# Patient Record
Sex: Male | Born: 1985 | Race: White | Hispanic: No | Marital: Married | Smoking: Never smoker
Health system: Southern US, Community
[De-identification: ages and names within clinical notes are randomized; demographics above are authoritative.]

## PROBLEM LIST (undated history)

## (undated) DIAGNOSIS — I10 Essential (primary) hypertension: Secondary | ICD-10-CM

## (undated) DIAGNOSIS — E039 Hypothyroidism, unspecified: Secondary | ICD-10-CM

## (undated) HISTORY — DX: Hypothyroidism, unspecified: E03.9

## (undated) HISTORY — DX: Essential (primary) hypertension: I10

---

## 2010-04-28 HISTORY — PX: THYROIDECTOMY: SHX17

## 2017-02-06 ENCOUNTER — Encounter: Payer: Self-pay | Admitting: Allergy & Immunology

## 2017-02-06 ENCOUNTER — Ambulatory Visit (INDEPENDENT_AMBULATORY_CARE_PROVIDER_SITE_OTHER): Payer: BLUE CROSS/BLUE SHIELD | Admitting: Allergy & Immunology

## 2017-02-06 VITALS — BP 142/80 | HR 84 | Temp 98.5°F | Resp 18 | Ht 72.5 in | Wt >= 6400 oz

## 2017-02-06 DIAGNOSIS — J302 Other seasonal allergic rhinitis: Secondary | ICD-10-CM

## 2017-02-06 DIAGNOSIS — J3089 Other allergic rhinitis: Secondary | ICD-10-CM

## 2017-02-06 MED ORDER — MONTELUKAST SODIUM 10 MG PO TABS: 10.0000 mg | ORAL_TABLET | Freq: Every day | ORAL | 5 refills | Status: DC

## 2017-02-06 NOTE — Progress Notes (Signed)
NEW PATIENT  Date of Service/Encounter:  02/06/17  Referring provider: Sharmon Leyden, MD   Assessment:   Seasonal and perennial allergic rhinitis (grasses, weeds, trees, molds, dust mites, cockroach)   Plan/Recommendations:   1. Seasonal and perennial allergic rhinitis - Testing today showed: trees, weeds, grasses, indoor molds, outdoor molds, dust mites and cockroach - Avoidance measures provided. - We will try to manage his symptoms with medications alone, and then consider allergy shots in the future.  - Continue with: Xyzal (levocetirizine)  tablet once daily - Start taking: Singulair (montelukast)  daily and Dymista (fluticasone/azelastine) two sprays per nostril 1-2 times daily as needed - You can use an extra dose of the antihistamine, if needed, for breakthrough symptoms.  - Consider nasal saline rinses 1-2 times daily to remove allergens from the nasal cavities as well as help with mucous clearance (this is especially helpful to do before the nasal sprays are given) - Consider allergy shots as a means of long-term control. - Allergy shots "re-train" and "reset" the immune system to ignore environmental allergens and decrease the resulting immune response to those allergens (sneezing, itchy watery eyes, runny nose, nasal congestion, etc).    - Allergy shots improve symptoms in 75-85% of patients.  - We can discuss more at the next appointment if the medications are not working for you.   2. Return in about 6 months (around 08/07/2017).  Subjective:   Timothy Jenkins is a 31 y.o. male presenting today for evaluation of  Chief Complaint  Patient presents with  . Allergies    Timothy Jenkins has a history of the following: There are no active problems to display for this patient.   History obtained from: chart review and patient and his wife.  Timothy Jenkins was referred by Sharmon Leyden, MD.     Timothy Jenkins is a 31 y.o. male presenting  for an allergy evaluation. He has had these symptoms for years. He thinks that he has had these since he was a child. He never got allergy tested. Symptoms are mainly nasal congestion and sneezing. He denies itchy watery eyes. He does have intermittent throat clearing. Symptoms are worse in the fall and the changes in the seasons. Spring is also a bad time of the year for him. There are no pets at home aside from an adorable 30 month old named Matthewland "[address] He has had dogs and cats in the past and they have not bothered him at all.   Currently he is using Xyzal which does provide some relief as needed. He does have breakthrough sneezing. He was prescribed a nose spray a while away, but it never worked for him. He is unsure which one he was taking.    Timothy Jenkins does not have asthma. He is not concerned with foods allergies at all. He says today that he can stand to be more picky about his foods. He is currently trying to adhere to a ketogenic diet. He has no history of urticaria. Otherwise, there is no history of other atopic diseases, including drug allergies, stinging insect allergies, or urticaria. There is no significant infectious history. Vaccinations are up to date.   He broke both legs, hip, and femur at age 33 years. He had to lear to re-walk at home.    Past Medical History: There are no active problems to display for this patient.   Medication List:  Allergies as of 02/06/2017      Reactions   Hydrocodone Nausea Only  Medication List       Accurate as of 02/06/17 10:13 AM. Always use your most recent med list.          levocetirizine 5 MG tablet Commonly known as:  XYZAL Take by mouth.   levothyroxine 175 MCG tablet Commonly known as:  SYNTHROID, LEVOTHROID 300 mcg.   lisinopril-hydrochlorothiazide 20-25 MG tablet Commonly known as:  PRINZIDE,ZESTORETIC   ranitidine 150 MG tablet Commonly known as:  ZANTAC Take 150 mg by mouth.       Birth History:  non-contributory.  Developmental History: non-contributory.   Past Surgical History: Past Surgical History:  Procedure Laterality Date  . THYROIDECTOMY  2012     Family History: Family History  Problem Relation Age of Onset  . Allergic rhinitis Mother   . Asthma Mother   . Urticaria Mother   . Emphysema Mother   . Sinusitis Mother      Social History: Timothy Jenkins lives at home with his wife and almost one year old son.     Review of Systems: a 14-point review of systems is pertinent for what is mentioned in HPI.  Otherwise, all other systems were negative. Constitutional: negative other than that listed in the HPI Eyes: negative other than that listed in the HPI Ears, nose, mouth, throat, and face: negative other than that listed in the HPI Respiratory: negative other than that listed in the HPI Cardiovascular: negative other than that listed in the HPI Gastrointestinal: negative other than that listed in the HPI Genitourinary: negative other than that listed in the HPI Integument: negative other than that listed in the HPI Hematologic: negative other than that listed in the HPI Musculoskeletal: negative other than that listed in the HPI Neurological: negative other than that listed in the HPI Allergy/Immunologic: negative other than that listed in the HPI    Objective:   Blood pressure (!) 142/80, pulse 84, temperature 98.5 F (36.9 C), temperature source Oral, resp. rate 18, height 6' 0.5" (1.842 m), weight (!) 445 lb 9.6 oz (202.1 kg). Body mass index is 59.6 kg/m.   Physical Exam:  General: Alert, interactive, in no acute distress. Pleasant obese male. Cooperative with the exam.  Eyes: No conjunctival injection bilaterally, no discharge on the right, no discharge on the left, no Horner-Trantas dots present and allergic shiners present bilaterally. PERRL bilaterally. EOMI without pain. No photophobia.  Ears: Left TM pearly gray with normal light reflex, Right  OME, Right TM intact without perforation and Left TM intact without perforation.  Nose/Throat: External nose within normal limits, nasal crease present and septum midline. Turbinates edematous and pale with clear discharge. Posterior oropharynx erythematous with cobblestoning in the posterior oropharynx. Tonsils 2+ without exudates.  Tongue without thrush. Neck: Supple without thyromegaly. Trachea midline. Adenopathy: shoddy bilateral anterior cervical lymphadenopathy Lungs: Clear to auscultation without wheezing, rhonchi or rales. No increased work of breathing. CV: Normal S1/S2. No murmurs. Capillary refill <2 seconds.  Abdomen: Nondistended, nontender. No guarding or rebound tenderness. Bowel sounds present in all fields and hypoactive  Skin: Warm and dry, without lesions or rashes. Extremities:  No clubbing, cyanosis or edema. Neuro:   Grossly intact. No focal deficits appreciated. Responsive to questions.  Diagnostic studies:    Allergy Studies:   Indoor/Outdoor Percutaneous Adult Environmental Panel: positive to cocklebur, burweed marsh elder, short ragweed, giant ragweed, rough marsh elder, red cedar, epicoccum, Df mite, Dp mites and cockroach. Otherwise negative with adequate controls.  Indoor/Outdoor Selected Intradermal Environmental Panel: positive to [place] grass, [employer]  grass, Grass mix, mold mix #1, mold mix #2, mold mix #3 and mold mix #4. Otherwise negative with adequate controls.      Malachi Bonds, MD [facility]

## 2017-02-06 NOTE — Patient Instructions (Addendum)
1. Seasonal and perennial allergic rhinitis - Testing today showed: trees, weeds, grasses, indoor molds, outdoor molds, dust mites and cockroach - Avoidance measures provided. - Continue with: Xyzal (levocetirizine)  tablet once daily - Start taking: Singulair (montelukast)  daily and Dymista (fluticasone/azelastine) two sprays per nostril 1-2 times daily as needed - You can use an extra dose of the antihistamine, if needed, for breakthrough symptoms.  - Consider nasal saline rinses 1-2 times daily to remove allergens from the nasal cavities as well as help with mucous clearance (this is especially helpful to do before the nasal sprays are given) - Consider allergy shots as a means of long-term control. - Allergy shots "re-train" and "reset" the immune system to ignore environmental allergens and decrease the resulting immune response to those allergens (sneezing, itchy watery eyes, runny nose, nasal congestion, etc).    - Allergy shots improve symptoms in 75-85% of patients.  - We can discuss more at the next appointment if the medications are not working for you.   2. Return in about 6 months (around 08/07/2017).   Please inform us of any Emergency Department visits, hospitalizations, or changes in symptoms. Call us before going to the ED for breathing or allergy symptoms since we might be able to fit you in for a sick visit. Feel free to contact us anytime with any questions, problems, or concerns.  It was a pleasure to see you and your family again today! Enjoy the upcoming fall season!  Websites that have reliable patient information: 1. American Academy of Asthma, Allergy, and Immunology: www.aaaai.org 2. Food Allergy Research and Education (FARE): foodallergy.org 3. Mothers of Asthmatics: http://www.asthmacommunitynetwork.org 4. American College of Allergy, Asthma, and Immunology: www.acaai.org   Election Day is coming up on Tuesday, November 6th! Make your voice heard!  Register to vote at [email or web address]!     The last day to register is October 12th!   You can check the status of your registration by looking it up at [email or web address]  [facility] of Elections: [email or web address]  [facility] of Elections:  [email or web address]  Reducing Pollen Exposure  The American Academy of Allergy, Asthma and Immunology suggests the following steps to reduce your exposure to pollen during allergy seasons.    1. Do not hang sheets or clothing out to dry; pollen may collect on these items. 2. Do not mow lawns or spend time around freshly cut grass; mowing stirs up pollen. 3. Keep windows closed at night.  Keep car windows closed while driving. 4. Minimize morning activities outdoors, a time when pollen counts are usually at their highest. 5. Stay indoors as much as possible when pollen counts or humidity is high and on windy days when pollen tends to remain in the air longer. 6. Use air conditioning when possible.  Many air conditioners have filters that trap the pollen spores. 7. Use a HEPA room air filter to remove pollen form the indoor air you breathe.  Control of Mold Allergen   Mold and fungi can grow on a variety of surfaces provided certain temperature and moisture conditions exist.  Outdoor molds grow on plants, decaying vegetation and soil.  The major outdoor mold, Alternaria and Cladosporium, are found in very high numbers during hot and dry conditions.  Generally, a late Summer - Fall peak is seen for common outdoor fungal spores.  Rain will temporarily lower outdoor mold spore count, but counts rise rapidly when the rainy period ends.  The most important indoor molds are Aspergillus and  Penicillium.  Dark, humid and poorly ventilated basements are ideal sites for mold growth.  The next most common sites of mold growth are the bathroom and the  kitchen.  Outdoor (Seasonal) Mold Control  Positive outdoor molds via skin testing: Alternaria, Cladosporium, Bipolaris (Helminthsporium), Drechslera (Curvalaria), Mucor and Epicoccum  1. Use air conditioning and keep windows closed 2. Avoid exposure to decaying vegetation. 3. Avoid leaf raking. 4. Avoid grain handling. 5. Consider wearing a face mask if working in moldy areas.  6.   Indoor (Perennial) Mold Control   Positive indoor molds via skin testing: Aspergillus, Penicillium, Fusarium, Aureobasidium (Pullulara) and Rhizopus  1. Maintain humidity below 50%. 2. Clean washable surfaces with 5% bleach solution. 3. Remove sources e.g. contaminated carpets.     Control of House Dust Mite Allergen    House dust mites play a major role in allergic asthma and rhinitis.  They occur in environments with high humidity wherever human skin, the food for dust mites is found. High levels have been detected in dust obtained from mattresses, pillows, carpets, upholstered furniture, bed covers, clothes and soft toys.  The principal allergen of the house dust mite is found in its feces.  A gram of dust may contain 1,000 mites and 250,000 fecal particles.  Mite antigen is easily measured in the air during house cleaning activities.    1. Encase mattresses, including the box spring, and pillow, in an air tight cover.  Seal the zipper end of the encased mattresses with wide adhesive tape. 2. Wash the bedding in water of 130 degrees Farenheit weekly.  Avoid cotton comforters/quilts and flannel bedding: the most ideal bed covering is the dacron comforter. 3. Remove all upholstered furniture from the bedroom. 4. Remove carpets, carpet padding, rugs, and non-washable window drapes from the bedroom.  Wash drapes weekly or use plastic window coverings. 5. Remove all non-washable stuffed toys from the bedroom.  Wash stuffed toys weekly. 6. Have the room cleaned frequently with a vacuum cleaner and a damp  dust-mop.  The patient should not be in a room which is being cleaned and should wait 1 hour after cleaning before going into the room. 7. Close and seal all heating outlets in the bedroom.  Otherwise, the room will become filled with dust-laden air.  An electric heater can be used to heat the room. 8. Reduce indoor humidity to less than 50%.  Do not use a humidifier.  Control of Cockroach Allergen  Cockroach allergen has been identified as an important cause of acute attacks of asthma, especially in urban settings.  There are fifty-five species of cockroach that exist in the [place], however only three, the [place], [place] species produce allergen that can affect patients with Asthma.  Allergens can be obtained from fecal particles, egg casings and secretions from cockroaches.    1. Remove food sources. 2. Reduce access to water. 3. Seal access and entry points. 4. Spray runways with 0.5-1% Diazinon or Chlorpyrifos 5. Blow boric acid power under stoves and refrigerator. 6. Place bait stations (hydramethylnon) at feeding sites.

## 2017-08-07 ENCOUNTER — Ambulatory Visit: Payer: BLUE CROSS/BLUE SHIELD | Admitting: Allergy & Immunology

## 2017-08-07 ENCOUNTER — Ambulatory Visit (INDEPENDENT_AMBULATORY_CARE_PROVIDER_SITE_OTHER): Payer: BLUE CROSS/BLUE SHIELD | Admitting: Allergy & Immunology

## 2017-08-07 ENCOUNTER — Encounter: Payer: Self-pay | Admitting: Allergy & Immunology

## 2017-08-07 VITALS — BP 170/106 | HR 80 | Temp 97.9°F | Resp 16

## 2017-08-07 DIAGNOSIS — J3089 Other allergic rhinitis: Secondary | ICD-10-CM

## 2017-08-07 DIAGNOSIS — J302 Other seasonal allergic rhinitis: Secondary | ICD-10-CM

## 2017-08-07 MED ORDER — MONTELUKAST SODIUM 10 MG PO TABS: ORAL_TABLET | ORAL | 5 refills | Status: DC

## 2017-08-07 MED ORDER — LEVOCETIRIZINE DIHYDROCHLORIDE 5 MG PO TABS: ORAL_TABLET | ORAL | 5 refills | Status: AC

## 2017-08-07 NOTE — Patient Instructions (Addendum)
1. Seasonal and perennial allergic rhinitis (trees, weeds, grasses, indoor molds, outdoor molds, dust mites and cockroach) - Continue with: Xyzal (levocetirizine) 5mg  tablet once daily, Singulair (montelukast) 10mg  daily and Dymista (fluticasone/azelastine) two sprays per nostril 1-2 times daily as needed - You can use an extra dose of the antihistamine, if needed, for breakthrough symptoms.  - Consider nasal saline rinses 1-2 times daily to remove allergens from the nasal cavities as well as help with mucous clearance (this is especially helpful to do before the nasal sprays are given) - Consider allergy shots as a means of long-term control. - Allergy shots "re-train" and "reset" the immune system to ignore environmental allergens and decrease the resulting immune response to those allergens (sneezing, itchy watery eyes, runny nose, nasal congestion, etc).    - Allergy shots improve symptoms in 75-85% of patients.   2. Return in about 1 year (around 08/08/2018). PICK UP YOUR BP MEDICATION!    Please inform us of any Emergency Department visits, hospitalizations, or changes in symptoms. Call us before going to the ED for breathing or allergy symptoms since we might be able to fit you in for a sick visit. Feel free to contact us anytime with any questions, problems, or concerns.  It was a pleasure to see you and your family again today!  Websites that have reliable patient information: 1. American Academy of Asthma, Allergy, and Immunology: www.aaaai.org 2. Food Allergy Research and Education (FARE): foodallergy.org 3. Mothers of Asthmatics: http://www.asthmacommunitynetwork.org 4. American College of Allergy, Asthma, and Immunology: www.acaai.org   Allergy Shots   Allergies are the result of a chain reaction that starts in the immune system. Your immune system controls how your body defends itself. For instance, if you have an allergy to pollen, your immune system identifies pollen as an  invader or allergen. Your immune system overreacts by producing antibodies called Immunoglobulin E (IgE). These antibodies travel to cells that release chemicals, causing an allergic reaction.  The concept behind allergy immunotherapy, whether it is received in the form of shots or tablets, is that the immune system can be desensitized to specific allergens that trigger allergy symptoms. Although it requires time and patience, the payback can be long-term relief.  How Do Allergy Shots Work?  Allergy shots work much like a vaccine. Your body responds to injected amounts of a particular allergen given in increasing doses, eventually developing a resistance and tolerance to it. Allergy shots can lead to decreased, minimal or no allergy symptoms.  There generally are two phases: build-up and maintenance. Build-up often ranges from three to six months and involves receiving injections with increasing amounts of the allergens. The shots are typically given once or twice a week, though more rapid build-up schedules are sometimes used.  The maintenance phase begins when the most effective dose is reached. This dose is different for each person, depending on how allergic you are and your response to the build-up injections. Once the maintenance dose is reached, there are longer periods between injections, typically two to four weeks.  Occasionally doctors give cortisone-type shots that can temporarily reduce allergy symptoms. These types of shots are different and should not be confused with allergy immunotherapy shots.  Who Can Be Treated with Allergy Shots?  Allergy shots may be a good treatment approach for people with allergic rhinitis (hay fever), allergic asthma, conjunctivitis (eye allergy) or stinging insect allergy.   Before deciding to begin allergy shots, you should consider:  . The length of allergy season and  the severity of your symptoms . Whether medications and/or changes to your  environment can control your symptoms . Your desire to avoid long-term medication use . Time: allergy immunotherapy requires a major time commitment . Cost: may vary depending on your insurance coverage  Allergy shots for children age 11five and older are effective and often well tolerated. They might prevent the onset of new allergen sensitivities or the progression to asthma.  Allergy shots are not started on patients who are pregnant but can be continued on patients who become pregnant while receiving them. In some patients with other medical conditions or who take certain common medications, allergy shots may be of risk. It is important to mention other medications you talk to your allergist.   When Will I Feel Better?  Some may experience decreased allergy symptoms during the build-up phase. For others, it may take as long as 12 months on the maintenance dose. If there is no improvement after a year of maintenance, your allergist will discuss other treatment options with you.  If you aren't responding to allergy shots, it may be because there is not enough dose of the allergen in your vaccine or there are missing allergens that were not identified during your allergy testing. Other reasons could be that there are high levels of the allergen in your environment or major exposure to non-allergic triggers like tobacco smoke.  What Is the Length of Treatment?  Once the maintenance dose is reached, allergy shots are generally continued for three to five years. The decision to stop should be discussed with your allergist at that time. Some people may experience a permanent reduction of allergy symptoms. Others may relapse and a longer course of allergy shots can be considered.  What Are the Possible Reactions?  The two types of adverse reactions that can occur with allergy shots are local and systemic. Common local reactions include very mild redness and swelling at the injection site, which can  happen immediately or several hours after. A systemic reaction, which is less common, affects the entire body or a particular body system. They are usually mild and typically respond quickly to medications. Signs include increased allergy symptoms such as sneezing, a stuffy nose or hives.  Rarely, a serious systemic reaction called anaphylaxis can develop. Symptoms include swelling in the throat, wheezing, a feeling of tightness in the chest, nausea or dizziness. Most serious systemic reactions develop within 30 minutes of allergy shots. This is why it is strongly recommended you wait in your doctor's office for 30 minutes after your injections. Your allergist is trained to watch for reactions, and his or her staff is trained and equipped with the proper medications to identify and treat them.  Who Should Administer Allergy Shots?  The preferred location for receiving shots is your prescribing allergist's office. Injections can sometimes be given at another facility where the physician and staff are trained to recognize and treat reactions, and have received instructions by your prescribing allergist.

## 2017-08-07 NOTE — Progress Notes (Signed)
FOLLOW UP  Date of Service/Encounter:  08/07/17   Assessment:   Seasonal and perennial allergic rhinitis (trees, weeds, grasses, indoor molds, outdoor molds, dust mites and cockroach)  Plan/Recommendations:    1. Seasonal and perennial allergic rhinitis (trees, weeds, grasses, indoor molds, outdoor molds, dust mites and cockroach) - Continue with: Xyzal (levocetirizine) 5mg  tablet once daily, Singulair (montelukast) 10mg  daily and Dymista (fluticasone/azelastine) two sprays per nostril 1-2 times daily as needed - You can use an extra dose of the antihistamine, if needed, for breakthrough symptoms.  - Consider nasal saline rinses 1-2 times daily to remove allergens from the nasal cavities as well as help with mucous clearance (this is especially helpful to do before the nasal sprays are given) - Consider allergy shots as a means of long-term control. - Allergy shots "re-train" and "reset" the immune system to ignore environmental allergens and decrease the resulting immune response to those allergens (sneezing, itchy watery eyes, runny nose, nasal congestion, etc).    - Allergy shots improve symptoms in 75-85% of patients.   2. Return in about 1 year (around 08/08/2018).   Subjective:   West BaliChristopher Fedorchak is a 32 y.o. male presenting today for follow up of  Chief Complaint  Patient presents with  . Allergic Rhinitis     West BaliChristopher Carras has a history of the following: There are no active problems to display for this patient.   History obtained from: chart review and patient and his wife.  Marga Hootshristopher Jager's Primary Care Provider is Delane GingerGill, Wendee BeaversPatrick M, MD.     Cristal DeerChristopher is a 32 y.o. male presenting for a follow up visit.  He was last seen as a new patient in October 2018.  At that time, he had testing that was positive to grasses, weeds, trees, molds, dust mite, and cockroach.  We started him on Xyzal 5 mg daily and also added on Singulair 10 mg daily and Dymista.  We  did discuss allergy shots as a means of long-term control.  Since the last visit, he has done well.  He remains on the Xyzal 5 mg daily and the Singulair 10 mg daily.  He does need refills on the Singulair and is wondering whether he can have a prescription for the Xyzal to see if his insurance will cover it.  He is still interested in allergy shots, but has not contacted his insurance company yet.  Overall he is happy with how well he is doing. He does not like the taste of the Dymista which he using only PRN. He has had no sinus infections in the interim. We are still thinking about shots. He recently changed hours at his job but they are not any better.   His blood pressure is elevated today, and he requested refills from his PCP a month ago but they are just now refilling it. He is going to pick up the medication today (lisinopril/HCTZ).   Otherwise, there have been no changes to his past medical history, surgical history, family history, or social history.  In the interim, he and his wife have found out that they will be having a baby girl whom they will name Zoe. Their little boy - Durene CalHunter - is not so sure about this. [employer] has paid leave people who have been there for a while.     Review of Systems: a 14-point review of systems is pertinent for what is mentioned in HPI.  Otherwise, all other systems were negative. Constitutional: negative other than that listed  in the HPI Eyes: negative other than that listed in the HPI Ears, nose, mouth, throat, and face: negative other than that listed in the HPI Respiratory: negative other than that listed in the HPI Cardiovascular: negative other than that listed in the HPI Gastrointestinal: negative other than that listed in the HPI Genitourinary: negative other than that listed in the HPI Integument: negative other than that listed in the HPI Hematologic: negative other than that listed in the HPI Musculoskeletal: negative other than that listed  in the HPI Neurological: negative other than that listed in the HPI Allergy/Immunologic: negative other than that listed in the HPI    Objective:   Blood pressure (!) 170/106, pulse 80, temperature 97.9 F (36.6 C), temperature source Oral, resp. rate 16, SpO2 97 %. There is no height or weight on file to calculate BMI.   Physical Exam:  General: Alert, interactive, in no acute distress. Pleasant.  Eyes: No conjunctival injection bilaterally, no discharge on the right, no discharge on the left, no Horner-Trantas dots present and allergic shiners present bilaterally. PERRL bilaterally. EOMI without pain. No photophobia.  Ears: Right TM pearly gray with normal light reflex, Left TM pearly gray with normal light reflex, Right TM intact without perforation and Left TM intact without perforation.  Nose/Throat: External nose within normal limits and septum midline. Turbinates edematous and pale with thick discharge. Posterior oropharynx mildly erythematous without cobblestoning in the posterior oropharynx. Tonsils 2+ without exudates.  Tongue without thrush. Lungs: Clear to auscultation without wheezing, rhonchi or rales. No increased work of breathing. CV: Normal S1/S2. No murmurs. Capillary refill <2 seconds.  Skin: Warm and dry, without lesions or rashes. Neuro:   Grossly intact. No focal deficits appreciated. Responsive to questions.  Diagnostic studies: none      Malachi Bonds, MD [facility]

## 2018-01-07 ENCOUNTER — Other Ambulatory Visit: Payer: Self-pay | Admitting: *Deleted

## 2018-01-07 MED ORDER — MONTELUKAST SODIUM 10 MG PO TABS: ORAL_TABLET | ORAL | 5 refills | Status: DC

## 2018-02-18 NOTE — Progress Notes (Signed)
We received notification from Timothy Jenkins that he is interested in pursuing allergen immunotherapy. Prescriptions written and routed to the Immunotherapy Team.   Malachi Bonds, MD  [facility]

## 2018-02-18 NOTE — Addendum Note (Signed)
Addended by: Alfonse Spruce on: 02/18/2018 09:10 AM   Modules accepted: Orders

## 2018-02-22 DIAGNOSIS — J301 Allergic rhinitis due to pollen: Secondary | ICD-10-CM

## 2018-02-22 NOTE — Progress Notes (Signed)
VIALS EXP 02-23-19 

## 2018-02-23 DIAGNOSIS — J3089 Other allergic rhinitis: Secondary | ICD-10-CM

## 2018-03-19 ENCOUNTER — Ambulatory Visit (INDEPENDENT_AMBULATORY_CARE_PROVIDER_SITE_OTHER): Payer: BLUE CROSS/BLUE SHIELD | Admitting: *Deleted

## 2018-03-19 DIAGNOSIS — J309 Allergic rhinitis, unspecified: Secondary | ICD-10-CM

## 2018-03-19 NOTE — Progress Notes (Signed)
Immunotherapy   Patient Details  Name: Timothy Jenkins MRN: [id number] Date of Birth: 06-09-1985  03/19/2018  Timothy Jenkins started injections for  mold-dustmite-cockroach and grass-weed-tree Following schedule: B  Frequency:2 times per week Epi-Pen:Epi-Pen Available  Consent signed and patient instructions given.   Timothy Jenkins 03/19/2018, 3:47 PM

## 2018-03-24 ENCOUNTER — Ambulatory Visit (INDEPENDENT_AMBULATORY_CARE_PROVIDER_SITE_OTHER): Payer: BLUE CROSS/BLUE SHIELD | Admitting: *Deleted

## 2018-03-24 DIAGNOSIS — J309 Allergic rhinitis, unspecified: Secondary | ICD-10-CM

## 2018-03-31 ENCOUNTER — Ambulatory Visit (INDEPENDENT_AMBULATORY_CARE_PROVIDER_SITE_OTHER): Payer: BLUE CROSS/BLUE SHIELD

## 2018-03-31 DIAGNOSIS — J309 Allergic rhinitis, unspecified: Secondary | ICD-10-CM | POA: Diagnosis not present

## 2018-04-06 ENCOUNTER — Ambulatory Visit (INDEPENDENT_AMBULATORY_CARE_PROVIDER_SITE_OTHER): Payer: BLUE CROSS/BLUE SHIELD | Admitting: *Deleted

## 2018-04-06 DIAGNOSIS — J309 Allergic rhinitis, unspecified: Secondary | ICD-10-CM | POA: Diagnosis not present

## 2018-04-09 ENCOUNTER — Ambulatory Visit (INDEPENDENT_AMBULATORY_CARE_PROVIDER_SITE_OTHER): Payer: BLUE CROSS/BLUE SHIELD

## 2018-04-09 DIAGNOSIS — J309 Allergic rhinitis, unspecified: Secondary | ICD-10-CM

## 2018-04-13 ENCOUNTER — Ambulatory Visit (INDEPENDENT_AMBULATORY_CARE_PROVIDER_SITE_OTHER): Payer: BLUE CROSS/BLUE SHIELD

## 2018-04-13 DIAGNOSIS — J309 Allergic rhinitis, unspecified: Secondary | ICD-10-CM

## 2018-04-20 ENCOUNTER — Ambulatory Visit (INDEPENDENT_AMBULATORY_CARE_PROVIDER_SITE_OTHER): Payer: BLUE CROSS/BLUE SHIELD

## 2018-04-20 DIAGNOSIS — J309 Allergic rhinitis, unspecified: Secondary | ICD-10-CM | POA: Diagnosis not present

## 2018-04-27 ENCOUNTER — Ambulatory Visit (INDEPENDENT_AMBULATORY_CARE_PROVIDER_SITE_OTHER): Payer: BLUE CROSS/BLUE SHIELD

## 2018-04-27 DIAGNOSIS — J309 Allergic rhinitis, unspecified: Secondary | ICD-10-CM | POA: Diagnosis not present

## 2018-05-04 ENCOUNTER — Ambulatory Visit (INDEPENDENT_AMBULATORY_CARE_PROVIDER_SITE_OTHER): Payer: BLUE CROSS/BLUE SHIELD

## 2018-05-04 DIAGNOSIS — J309 Allergic rhinitis, unspecified: Secondary | ICD-10-CM

## 2018-05-06 ENCOUNTER — Ambulatory Visit (INDEPENDENT_AMBULATORY_CARE_PROVIDER_SITE_OTHER): Payer: BLUE CROSS/BLUE SHIELD

## 2018-05-06 DIAGNOSIS — J309 Allergic rhinitis, unspecified: Secondary | ICD-10-CM | POA: Diagnosis not present

## 2018-05-11 ENCOUNTER — Ambulatory Visit (INDEPENDENT_AMBULATORY_CARE_PROVIDER_SITE_OTHER): Payer: BLUE CROSS/BLUE SHIELD | Admitting: *Deleted

## 2018-05-11 DIAGNOSIS — J309 Allergic rhinitis, unspecified: Secondary | ICD-10-CM | POA: Diagnosis not present

## 2018-05-18 ENCOUNTER — Ambulatory Visit (INDEPENDENT_AMBULATORY_CARE_PROVIDER_SITE_OTHER): Payer: BLUE CROSS/BLUE SHIELD

## 2018-05-18 DIAGNOSIS — J309 Allergic rhinitis, unspecified: Secondary | ICD-10-CM

## 2018-05-26 ENCOUNTER — Ambulatory Visit (INDEPENDENT_AMBULATORY_CARE_PROVIDER_SITE_OTHER): Payer: BLUE CROSS/BLUE SHIELD | Admitting: *Deleted

## 2018-05-26 DIAGNOSIS — J309 Allergic rhinitis, unspecified: Secondary | ICD-10-CM | POA: Diagnosis not present

## 2018-06-02 ENCOUNTER — Ambulatory Visit (INDEPENDENT_AMBULATORY_CARE_PROVIDER_SITE_OTHER): Payer: BLUE CROSS/BLUE SHIELD | Admitting: *Deleted

## 2018-06-02 DIAGNOSIS — J309 Allergic rhinitis, unspecified: Secondary | ICD-10-CM | POA: Diagnosis not present

## 2018-06-10 ENCOUNTER — Ambulatory Visit: Payer: Self-pay

## 2018-06-10 ENCOUNTER — Ambulatory Visit (INDEPENDENT_AMBULATORY_CARE_PROVIDER_SITE_OTHER): Payer: BLUE CROSS/BLUE SHIELD

## 2018-06-10 DIAGNOSIS — J309 Allergic rhinitis, unspecified: Secondary | ICD-10-CM

## 2018-06-16 ENCOUNTER — Ambulatory Visit (INDEPENDENT_AMBULATORY_CARE_PROVIDER_SITE_OTHER): Payer: BLUE CROSS/BLUE SHIELD | Admitting: *Deleted

## 2018-06-16 DIAGNOSIS — J309 Allergic rhinitis, unspecified: Secondary | ICD-10-CM | POA: Diagnosis not present

## 2018-06-21 ENCOUNTER — Ambulatory Visit: Payer: Self-pay

## 2018-06-24 ENCOUNTER — Ambulatory Visit (INDEPENDENT_AMBULATORY_CARE_PROVIDER_SITE_OTHER): Payer: BLUE CROSS/BLUE SHIELD

## 2018-06-24 DIAGNOSIS — J309 Allergic rhinitis, unspecified: Secondary | ICD-10-CM

## 2018-07-01 ENCOUNTER — Ambulatory Visit (INDEPENDENT_AMBULATORY_CARE_PROVIDER_SITE_OTHER): Payer: BLUE CROSS/BLUE SHIELD

## 2018-07-01 DIAGNOSIS — J309 Allergic rhinitis, unspecified: Secondary | ICD-10-CM

## 2018-07-07 ENCOUNTER — Ambulatory Visit (INDEPENDENT_AMBULATORY_CARE_PROVIDER_SITE_OTHER): Payer: BLUE CROSS/BLUE SHIELD

## 2018-07-07 DIAGNOSIS — J309 Allergic rhinitis, unspecified: Secondary | ICD-10-CM

## 2018-07-14 ENCOUNTER — Ambulatory Visit (INDEPENDENT_AMBULATORY_CARE_PROVIDER_SITE_OTHER): Payer: BLUE CROSS/BLUE SHIELD

## 2018-07-14 DIAGNOSIS — J309 Allergic rhinitis, unspecified: Secondary | ICD-10-CM

## 2018-07-21 ENCOUNTER — Ambulatory Visit (INDEPENDENT_AMBULATORY_CARE_PROVIDER_SITE_OTHER): Payer: BLUE CROSS/BLUE SHIELD

## 2018-07-21 DIAGNOSIS — J309 Allergic rhinitis, unspecified: Secondary | ICD-10-CM | POA: Diagnosis not present

## 2018-07-29 ENCOUNTER — Ambulatory Visit (INDEPENDENT_AMBULATORY_CARE_PROVIDER_SITE_OTHER): Payer: BLUE CROSS/BLUE SHIELD | Admitting: *Deleted

## 2018-07-29 DIAGNOSIS — J309 Allergic rhinitis, unspecified: Secondary | ICD-10-CM | POA: Diagnosis not present

## 2018-08-05 ENCOUNTER — Ambulatory Visit (INDEPENDENT_AMBULATORY_CARE_PROVIDER_SITE_OTHER): Payer: BLUE CROSS/BLUE SHIELD

## 2018-08-05 DIAGNOSIS — J309 Allergic rhinitis, unspecified: Secondary | ICD-10-CM

## 2018-08-11 ENCOUNTER — Ambulatory Visit (INDEPENDENT_AMBULATORY_CARE_PROVIDER_SITE_OTHER): Payer: BLUE CROSS/BLUE SHIELD

## 2018-08-11 DIAGNOSIS — J309 Allergic rhinitis, unspecified: Secondary | ICD-10-CM | POA: Diagnosis not present

## 2018-08-13 ENCOUNTER — Ambulatory Visit: Payer: BLUE CROSS/BLUE SHIELD | Admitting: Allergy & Immunology

## 2018-08-19 ENCOUNTER — Ambulatory Visit (INDEPENDENT_AMBULATORY_CARE_PROVIDER_SITE_OTHER): Payer: BLUE CROSS/BLUE SHIELD

## 2018-08-19 DIAGNOSIS — J309 Allergic rhinitis, unspecified: Secondary | ICD-10-CM | POA: Diagnosis not present

## 2018-08-26 ENCOUNTER — Ambulatory Visit (INDEPENDENT_AMBULATORY_CARE_PROVIDER_SITE_OTHER): Payer: BLUE CROSS/BLUE SHIELD

## 2018-08-26 DIAGNOSIS — J309 Allergic rhinitis, unspecified: Secondary | ICD-10-CM | POA: Diagnosis not present

## 2018-09-02 ENCOUNTER — Ambulatory Visit (INDEPENDENT_AMBULATORY_CARE_PROVIDER_SITE_OTHER): Payer: BLUE CROSS/BLUE SHIELD

## 2018-09-02 DIAGNOSIS — J309 Allergic rhinitis, unspecified: Secondary | ICD-10-CM

## 2018-09-08 ENCOUNTER — Ambulatory Visit (INDEPENDENT_AMBULATORY_CARE_PROVIDER_SITE_OTHER): Payer: BLUE CROSS/BLUE SHIELD

## 2018-09-08 DIAGNOSIS — J309 Allergic rhinitis, unspecified: Secondary | ICD-10-CM | POA: Diagnosis not present

## 2018-09-16 ENCOUNTER — Ambulatory Visit (INDEPENDENT_AMBULATORY_CARE_PROVIDER_SITE_OTHER): Payer: BLUE CROSS/BLUE SHIELD

## 2018-09-16 DIAGNOSIS — J309 Allergic rhinitis, unspecified: Secondary | ICD-10-CM

## 2018-09-22 ENCOUNTER — Ambulatory Visit (INDEPENDENT_AMBULATORY_CARE_PROVIDER_SITE_OTHER): Payer: BLUE CROSS/BLUE SHIELD

## 2018-09-22 DIAGNOSIS — J309 Allergic rhinitis, unspecified: Secondary | ICD-10-CM | POA: Diagnosis not present

## 2018-09-30 ENCOUNTER — Ambulatory Visit (INDEPENDENT_AMBULATORY_CARE_PROVIDER_SITE_OTHER): Payer: BLUE CROSS/BLUE SHIELD

## 2018-09-30 DIAGNOSIS — J309 Allergic rhinitis, unspecified: Secondary | ICD-10-CM

## 2018-10-06 ENCOUNTER — Ambulatory Visit (INDEPENDENT_AMBULATORY_CARE_PROVIDER_SITE_OTHER): Payer: BLUE CROSS/BLUE SHIELD

## 2018-10-06 DIAGNOSIS — J309 Allergic rhinitis, unspecified: Secondary | ICD-10-CM | POA: Diagnosis not present

## 2018-10-14 ENCOUNTER — Ambulatory Visit (INDEPENDENT_AMBULATORY_CARE_PROVIDER_SITE_OTHER): Payer: BLUE CROSS/BLUE SHIELD

## 2018-10-14 DIAGNOSIS — J309 Allergic rhinitis, unspecified: Secondary | ICD-10-CM | POA: Diagnosis not present

## 2018-10-20 ENCOUNTER — Ambulatory Visit (INDEPENDENT_AMBULATORY_CARE_PROVIDER_SITE_OTHER): Payer: BLUE CROSS/BLUE SHIELD

## 2018-10-20 DIAGNOSIS — J301 Allergic rhinitis due to pollen: Secondary | ICD-10-CM

## 2018-10-27 ENCOUNTER — Ambulatory Visit (INDEPENDENT_AMBULATORY_CARE_PROVIDER_SITE_OTHER): Payer: BLUE CROSS/BLUE SHIELD | Admitting: *Deleted

## 2018-10-27 DIAGNOSIS — J301 Allergic rhinitis due to pollen: Secondary | ICD-10-CM | POA: Diagnosis not present

## 2018-11-03 ENCOUNTER — Ambulatory Visit (INDEPENDENT_AMBULATORY_CARE_PROVIDER_SITE_OTHER): Payer: BLUE CROSS/BLUE SHIELD

## 2018-11-03 DIAGNOSIS — J301 Allergic rhinitis due to pollen: Secondary | ICD-10-CM | POA: Diagnosis not present

## 2018-11-11 ENCOUNTER — Ambulatory Visit (INDEPENDENT_AMBULATORY_CARE_PROVIDER_SITE_OTHER): Payer: BLUE CROSS/BLUE SHIELD

## 2018-11-11 DIAGNOSIS — J309 Allergic rhinitis, unspecified: Secondary | ICD-10-CM | POA: Diagnosis not present

## 2018-11-15 ENCOUNTER — Other Ambulatory Visit: Payer: Self-pay

## 2018-11-15 DIAGNOSIS — Z20822 Contact with and (suspected) exposure to covid-19: Secondary | ICD-10-CM

## 2018-11-15 NOTE — Addendum Note (Signed)
Addended by: Taraoluwa Thakur M on: 11/15/2018 08:53 PM   Modules accepted: Orders  

## 2018-11-16 ENCOUNTER — Ambulatory Visit (INDEPENDENT_AMBULATORY_CARE_PROVIDER_SITE_OTHER): Payer: BLUE CROSS/BLUE SHIELD

## 2018-11-16 DIAGNOSIS — J309 Allergic rhinitis, unspecified: Secondary | ICD-10-CM

## 2018-11-17 LAB — NOVEL CORONAVIRUS, NAA: SARS-CoV-2, NAA: NOT DETECTED

## 2018-11-22 ENCOUNTER — Ambulatory Visit (INDEPENDENT_AMBULATORY_CARE_PROVIDER_SITE_OTHER): Payer: BLUE CROSS/BLUE SHIELD

## 2018-11-22 DIAGNOSIS — J309 Allergic rhinitis, unspecified: Secondary | ICD-10-CM | POA: Diagnosis not present

## 2018-11-23 ENCOUNTER — Other Ambulatory Visit: Payer: Self-pay | Admitting: Foot & Ankle Surgery

## 2018-11-23 DIAGNOSIS — M76821 Posterior tibial tendinitis, right leg: Secondary | ICD-10-CM

## 2018-11-30 ENCOUNTER — Ambulatory Visit (INDEPENDENT_AMBULATORY_CARE_PROVIDER_SITE_OTHER): Payer: BLUE CROSS/BLUE SHIELD

## 2018-11-30 DIAGNOSIS — J309 Allergic rhinitis, unspecified: Secondary | ICD-10-CM

## 2018-12-08 ENCOUNTER — Ambulatory Visit (INDEPENDENT_AMBULATORY_CARE_PROVIDER_SITE_OTHER): Payer: BLUE CROSS/BLUE SHIELD

## 2018-12-08 DIAGNOSIS — J309 Allergic rhinitis, unspecified: Secondary | ICD-10-CM

## 2018-12-13 NOTE — Progress Notes (Signed)
EXP 12/14/19 

## 2018-12-15 DIAGNOSIS — J301 Allergic rhinitis due to pollen: Secondary | ICD-10-CM | POA: Diagnosis not present

## 2018-12-17 ENCOUNTER — Ambulatory Visit (INDEPENDENT_AMBULATORY_CARE_PROVIDER_SITE_OTHER): Payer: BC Managed Care – PPO

## 2018-12-17 ENCOUNTER — Ambulatory Visit
Admission: RE | Admit: 2018-12-17 | Discharge: 2018-12-17 | Disposition: A | Discharge status: Home / Self Care | Payer: BLUE CROSS/BLUE SHIELD | Source: Ambulatory Visit | Attending: Foot & Ankle Surgery | Admitting: Foot & Ankle Surgery

## 2018-12-17 ENCOUNTER — Other Ambulatory Visit: Payer: Self-pay

## 2018-12-17 DIAGNOSIS — J309 Allergic rhinitis, unspecified: Secondary | ICD-10-CM

## 2018-12-17 DIAGNOSIS — M76821 Posterior tibial tendinitis, right leg: Secondary | ICD-10-CM

## 2018-12-21 ENCOUNTER — Ambulatory Visit (INDEPENDENT_AMBULATORY_CARE_PROVIDER_SITE_OTHER): Payer: BC Managed Care – PPO

## 2018-12-21 DIAGNOSIS — J309 Allergic rhinitis, unspecified: Secondary | ICD-10-CM

## 2018-12-29 ENCOUNTER — Ambulatory Visit (INDEPENDENT_AMBULATORY_CARE_PROVIDER_SITE_OTHER): Payer: BC Managed Care – PPO

## 2018-12-29 DIAGNOSIS — J309 Allergic rhinitis, unspecified: Secondary | ICD-10-CM | POA: Diagnosis not present

## 2019-01-05 ENCOUNTER — Ambulatory Visit (INDEPENDENT_AMBULATORY_CARE_PROVIDER_SITE_OTHER): Payer: BC Managed Care – PPO

## 2019-01-05 DIAGNOSIS — J309 Allergic rhinitis, unspecified: Secondary | ICD-10-CM

## 2019-01-10 ENCOUNTER — Ambulatory Visit (INDEPENDENT_AMBULATORY_CARE_PROVIDER_SITE_OTHER): Payer: BC Managed Care – PPO

## 2019-01-10 DIAGNOSIS — J309 Allergic rhinitis, unspecified: Secondary | ICD-10-CM

## 2019-01-18 ENCOUNTER — Ambulatory Visit (INDEPENDENT_AMBULATORY_CARE_PROVIDER_SITE_OTHER): Payer: BC Managed Care – PPO

## 2019-01-18 DIAGNOSIS — J309 Allergic rhinitis, unspecified: Secondary | ICD-10-CM | POA: Diagnosis not present

## 2019-01-25 ENCOUNTER — Ambulatory Visit (INDEPENDENT_AMBULATORY_CARE_PROVIDER_SITE_OTHER): Payer: BC Managed Care – PPO

## 2019-01-25 DIAGNOSIS — J309 Allergic rhinitis, unspecified: Secondary | ICD-10-CM

## 2019-01-31 ENCOUNTER — Encounter: Payer: Self-pay | Admitting: Allergy & Immunology

## 2019-01-31 ENCOUNTER — Other Ambulatory Visit: Payer: Self-pay

## 2019-01-31 ENCOUNTER — Ambulatory Visit (INDEPENDENT_AMBULATORY_CARE_PROVIDER_SITE_OTHER): Payer: BC Managed Care – PPO | Admitting: Allergy & Immunology

## 2019-01-31 DIAGNOSIS — J302 Other seasonal allergic rhinitis: Secondary | ICD-10-CM

## 2019-01-31 DIAGNOSIS — J3089 Other allergic rhinitis: Secondary | ICD-10-CM | POA: Diagnosis not present

## 2019-01-31 MED ORDER — MONTELUKAST SODIUM 10 MG PO TABS: 10.0000 mg | ORAL_TABLET | Freq: Every day | ORAL | 11 refills | Status: AC

## 2019-01-31 NOTE — Progress Notes (Signed)
RE: Timothy Jenkins MRN: [id number] DOB: [birth year] Date of Telemedicine Visit: 01/31/2019  Referring provider: Marijo File, MD Primary care provider: Marijo File, MD  Chief Complaint: Allergic Rhinitis  (Doing really well)   Telemedicine Follow Up Visit via Telephone: I connected with Timothy Jenkins for a follow up on 01/31/19 by telephone and verified that I am speaking with the correct person using two identifiers.   I discussed the limitations, risks, security and privacy concerns of performing an evaluation and management service by telephone and the availability of in person appointments. I also discussed with the patient that there may be a patient responsible charge related to this service. The patient expressed understanding and agreed to proceed.  Patient is in his car.  Provider is at the office.  Visit start time: 3:32 PM Visit end time: 3:43 PM Insurance consent/check in by: [facility] consent and medical assistant/nurse: [place]  History of Present Illness:  He is a 33 y.o. male, who is being followed for perennial and seasonal allergic rhinitis. His previous allergy office visit was in April 2019 with myself.  At that time, we continued with Xyzal 5 mg daily, Singulair 10 mg daily, and Dymista 2 sprays per nostril up to twice daily.  He did decide to start allergen immunotherapy.  He has not started it and has tolerated this well without adverse event.  Since last visit, he has done well.  He is now taking only Singulair and cetirizine.  He did run out of Singulair and would like a refill for it.  His wife reports that it helped with the snoring.  He is not using any nasal sprays at all.  He has not needed any antibiotics at all.  [place] is on allergen immunotherapy. He receives two injections. Immunotherapy script #1 contains molds, dust mites and cockroach. He currently receives 0.9mL of the RED vial (1/100). Immunotherapy script #2  contains trees, weeds and grasses. He currently receives 0.64mL of the RED vial (1/100). He started shots November of 2019 and reached maintenance in July of 2020.  Otherwise, there have been no changes to his past medical history, surgical history, family history, or social history.  Assessment and Plan:  Timothy Jenkins is a 33 y.o. male with:  Seasonal and perennial allergic rhinitis (trees, weeds, grasses, indoor molds, outdoor molds, dust mites and cockroach)   1. Seasonal and perennial allergic rhinitis (trees, weeds, grasses, indoor molds, outdoor molds, dust mites and cockroach) - Continue with: Zyrtec (cetirizine) 10mg  tablet once daily and Singulair (montelukast) 10mg  daily - You can use an extra dose of the antihistamine, if needed, for breakthrough symptoms.  - Continue with allergy shots at the same schedule. - Anticipate continuing through November 2024 for five years total.   2. Return in about 1 year (around 01/31/2020). This can be an in-person, a virtual Webex or a telephone follow up visit.     Diagnostics: None.  Medication List:  Current Outpatient Medications  Medication Sig Dispense Refill  . levocetirizine (XYZAL) 5 MG tablet One tablet daily as needed for itching or runny nose. 30 tablet 5  . levothyroxine (SYNTHROID, LEVOTHROID) 175 MCG tablet 300 mcg.     . lisinopril-hydrochlorothiazide (PRINZIDE,ZESTORETIC) 20-25 MG tablet     . montelukast (SINGULAIR) 10 MG tablet 1 tablet once at night for coughing or wheezing. 30 tablet 5  . ranitidine (ZANTAC) 150 MG tablet Take 150 mg by mouth.     No current facility-administered medications for this visit.  Allergies: Allergies  Allergen Reactions  . Hydrocodone Nausea Only   I reviewed his past medical history, social history, family history, and environmental history and no significant changes have been reported from previous visits.  Review of Systems  Constitutional: Negative for activity change,  appetite change, chills, diaphoresis, fatigue and fever.  HENT: Negative for congestion, ear discharge, ear pain, facial swelling, postnasal drip, rhinorrhea, sinus pressure, sinus pain and sore throat.   Eyes: Negative for pain, discharge and itching.  Respiratory: Negative for apnea, cough, chest tightness and shortness of breath.   Cardiovascular: Negative for chest pain.  Gastrointestinal: Negative for diarrhea and nausea.  Endocrine: Negative for cold intolerance and heat intolerance.  Musculoskeletal: Negative for arthralgias and myalgias.  Skin: Negative for rash.  Allergic/Immunologic: Positive for environmental allergies. Negative for food allergies and immunocompromised state.    Objective:  Physical exam not obtained as encounter was done via telephone.   Previous notes and tests were reviewed.  I discussed the assessment and treatment plan with the patient. The patient was provided an opportunity to ask questions and all were answered. The patient agreed with the plan and demonstrated an understanding of the instructions.   The patient was advised to call back or seek an in-person evaluation if the symptoms worsen or if the condition fails to improve as anticipated.  I provided 11 minutes of non-face-to-face time during this encounter.  It was my pleasure to participate in [place] care today. Please feel free to contact me with any questions or concerns.   Sincerely,  Alfonse Spruce, MD

## 2019-01-31 NOTE — Patient Instructions (Addendum)
1. Seasonal and perennial allergic rhinitis (trees, weeds, grasses, indoor molds, outdoor molds, dust mites and cockroach) - Continue with: Zyrtec (cetirizine) 10mg  tablet once daily and Singulair (montelukast) 10mg  daily - You can use an extra dose of the antihistamine, if needed, for breakthrough symptoms.  - Continue with allergy shots at the same schedule. - Anticipate continuing through November 2024 for five years total.   2. Return in about 1 year (around 01/31/2020). This can be an in-person, a virtual Webex or a telephone follow up visit.   Please inform us of any Emergency Department visits, hospitalizations, or changes in symptoms. Call us before going to the ED for breathing or allergy symptoms since we might be able to fit you in for a sick visit. Feel free to contact us anytime with any questions, problems, or concerns.  It was a pleasure to talk to you today today!  Websites that have reliable patient information: 1. American Academy of Asthma, Allergy, and Immunology: www.aaaai.org 2. Food Allergy Research and Education (FARE): foodallergy.org 3. Mothers of Asthmatics: http://www.asthmacommunitynetwork.org 4. American College of Allergy, Asthma, and Immunology: www.acaai.org  "Like" [place] on Facebook and Instagram for our latest updates!      Make sure you are registered to vote! If you have moved or changed any of your contact information, you will need to get this updated before voting!  In some cases, you MAY be able to register to vote online: [email or web address]    Voter ID laws are NOT going into effect for the General Election in November 2020! DO NOT let this stop you from exercising your right to vote!   Absentee voting is the SAFEST way to vote during the coronavirus pandemic!   Download and print an absentee ballot request form at [email or web address] or you can scan the QR code below with your smart phone:      More  information on absentee ballots can be found here: [email or web address]

## 2019-02-01 ENCOUNTER — Ambulatory Visit (INDEPENDENT_AMBULATORY_CARE_PROVIDER_SITE_OTHER): Payer: BC Managed Care – PPO

## 2019-02-01 DIAGNOSIS — J309 Allergic rhinitis, unspecified: Secondary | ICD-10-CM

## 2019-02-16 ENCOUNTER — Ambulatory Visit (INDEPENDENT_AMBULATORY_CARE_PROVIDER_SITE_OTHER): Payer: BC Managed Care – PPO

## 2019-02-16 DIAGNOSIS — J309 Allergic rhinitis, unspecified: Secondary | ICD-10-CM

## 2019-02-23 ENCOUNTER — Ambulatory Visit (INDEPENDENT_AMBULATORY_CARE_PROVIDER_SITE_OTHER): Payer: BC Managed Care – PPO

## 2019-02-23 DIAGNOSIS — J309 Allergic rhinitis, unspecified: Secondary | ICD-10-CM

## 2019-03-02 ENCOUNTER — Ambulatory Visit (INDEPENDENT_AMBULATORY_CARE_PROVIDER_SITE_OTHER): Payer: BC Managed Care – PPO

## 2019-03-02 DIAGNOSIS — J309 Allergic rhinitis, unspecified: Secondary | ICD-10-CM

## 2019-03-23 ENCOUNTER — Ambulatory Visit (INDEPENDENT_AMBULATORY_CARE_PROVIDER_SITE_OTHER): Payer: BC Managed Care – PPO

## 2019-03-23 DIAGNOSIS — J309 Allergic rhinitis, unspecified: Secondary | ICD-10-CM | POA: Diagnosis not present

## 2019-03-31 ENCOUNTER — Ambulatory Visit (INDEPENDENT_AMBULATORY_CARE_PROVIDER_SITE_OTHER): Payer: BC Managed Care – PPO | Admitting: *Deleted

## 2019-03-31 DIAGNOSIS — J309 Allergic rhinitis, unspecified: Secondary | ICD-10-CM

## 2019-04-07 ENCOUNTER — Ambulatory Visit (INDEPENDENT_AMBULATORY_CARE_PROVIDER_SITE_OTHER): Payer: BC Managed Care – PPO

## 2019-04-07 DIAGNOSIS — J309 Allergic rhinitis, unspecified: Secondary | ICD-10-CM

## 2019-04-27 ENCOUNTER — Ambulatory Visit (INDEPENDENT_AMBULATORY_CARE_PROVIDER_SITE_OTHER): Payer: BC Managed Care – PPO

## 2019-04-27 DIAGNOSIS — J309 Allergic rhinitis, unspecified: Secondary | ICD-10-CM | POA: Diagnosis not present

## 2019-05-05 ENCOUNTER — Ambulatory Visit (INDEPENDENT_AMBULATORY_CARE_PROVIDER_SITE_OTHER): Payer: BC Managed Care – PPO

## 2019-05-05 DIAGNOSIS — J309 Allergic rhinitis, unspecified: Secondary | ICD-10-CM | POA: Diagnosis not present

## 2019-05-10 ENCOUNTER — Ambulatory Visit (INDEPENDENT_AMBULATORY_CARE_PROVIDER_SITE_OTHER): Payer: BC Managed Care – PPO

## 2019-05-10 DIAGNOSIS — J309 Allergic rhinitis, unspecified: Secondary | ICD-10-CM

## 2019-05-19 ENCOUNTER — Ambulatory Visit (INDEPENDENT_AMBULATORY_CARE_PROVIDER_SITE_OTHER): Payer: BC Managed Care – PPO

## 2019-05-19 DIAGNOSIS — J309 Allergic rhinitis, unspecified: Secondary | ICD-10-CM | POA: Diagnosis not present

## 2019-05-24 ENCOUNTER — Ambulatory Visit (INDEPENDENT_AMBULATORY_CARE_PROVIDER_SITE_OTHER): Payer: BC Managed Care – PPO

## 2019-05-24 DIAGNOSIS — J309 Allergic rhinitis, unspecified: Secondary | ICD-10-CM

## 2019-05-25 DIAGNOSIS — J301 Allergic rhinitis due to pollen: Secondary | ICD-10-CM | POA: Diagnosis not present

## 2019-05-25 NOTE — Progress Notes (Signed)
VIALS EXP 05-24-20 

## 2019-06-01 ENCOUNTER — Ambulatory Visit (INDEPENDENT_AMBULATORY_CARE_PROVIDER_SITE_OTHER): Payer: BC Managed Care – PPO

## 2019-06-01 DIAGNOSIS — J309 Allergic rhinitis, unspecified: Secondary | ICD-10-CM

## 2019-06-09 ENCOUNTER — Ambulatory Visit (INDEPENDENT_AMBULATORY_CARE_PROVIDER_SITE_OTHER): Payer: BC Managed Care – PPO | Admitting: *Deleted

## 2019-06-09 DIAGNOSIS — J309 Allergic rhinitis, unspecified: Secondary | ICD-10-CM | POA: Diagnosis not present

## 2019-06-17 ENCOUNTER — Ambulatory Visit (INDEPENDENT_AMBULATORY_CARE_PROVIDER_SITE_OTHER): Payer: BC Managed Care – PPO | Admitting: *Deleted

## 2019-06-17 DIAGNOSIS — J309 Allergic rhinitis, unspecified: Secondary | ICD-10-CM | POA: Diagnosis not present

## 2019-06-23 ENCOUNTER — Ambulatory Visit (INDEPENDENT_AMBULATORY_CARE_PROVIDER_SITE_OTHER): Payer: BC Managed Care – PPO | Admitting: *Deleted

## 2019-06-23 DIAGNOSIS — J309 Allergic rhinitis, unspecified: Secondary | ICD-10-CM | POA: Diagnosis not present

## 2019-06-28 ENCOUNTER — Ambulatory Visit (INDEPENDENT_AMBULATORY_CARE_PROVIDER_SITE_OTHER): Payer: BC Managed Care – PPO

## 2019-06-28 DIAGNOSIS — J309 Allergic rhinitis, unspecified: Secondary | ICD-10-CM

## 2019-07-12 ENCOUNTER — Ambulatory Visit (INDEPENDENT_AMBULATORY_CARE_PROVIDER_SITE_OTHER): Payer: BC Managed Care – PPO | Admitting: *Deleted

## 2019-07-12 DIAGNOSIS — J309 Allergic rhinitis, unspecified: Secondary | ICD-10-CM

## 2019-07-26 NOTE — Progress Notes (Signed)
VIALS EXP 07-25-20 

## 2019-07-27 DIAGNOSIS — J301 Allergic rhinitis due to pollen: Secondary | ICD-10-CM | POA: Diagnosis not present

## 2019-07-28 ENCOUNTER — Ambulatory Visit (INDEPENDENT_AMBULATORY_CARE_PROVIDER_SITE_OTHER): Payer: BC Managed Care – PPO

## 2019-07-28 DIAGNOSIS — J309 Allergic rhinitis, unspecified: Secondary | ICD-10-CM

## 2019-08-11 ENCOUNTER — Ambulatory Visit (INDEPENDENT_AMBULATORY_CARE_PROVIDER_SITE_OTHER): Payer: BC Managed Care – PPO

## 2019-08-11 DIAGNOSIS — J309 Allergic rhinitis, unspecified: Secondary | ICD-10-CM

## 2019-08-25 ENCOUNTER — Ambulatory Visit (INDEPENDENT_AMBULATORY_CARE_PROVIDER_SITE_OTHER): Payer: BC Managed Care – PPO

## 2019-08-25 DIAGNOSIS — J309 Allergic rhinitis, unspecified: Secondary | ICD-10-CM

## 2019-09-08 ENCOUNTER — Ambulatory Visit (INDEPENDENT_AMBULATORY_CARE_PROVIDER_SITE_OTHER): Payer: BC Managed Care – PPO

## 2019-09-08 DIAGNOSIS — J309 Allergic rhinitis, unspecified: Secondary | ICD-10-CM | POA: Diagnosis not present

## 2019-09-19 ENCOUNTER — Ambulatory Visit (INDEPENDENT_AMBULATORY_CARE_PROVIDER_SITE_OTHER): Payer: BC Managed Care – PPO

## 2019-09-19 DIAGNOSIS — J309 Allergic rhinitis, unspecified: Secondary | ICD-10-CM | POA: Diagnosis not present

## 2019-10-06 ENCOUNTER — Ambulatory Visit (INDEPENDENT_AMBULATORY_CARE_PROVIDER_SITE_OTHER): Payer: BC Managed Care – PPO

## 2019-10-06 DIAGNOSIS — J309 Allergic rhinitis, unspecified: Secondary | ICD-10-CM

## 2019-10-12 ENCOUNTER — Ambulatory Visit (INDEPENDENT_AMBULATORY_CARE_PROVIDER_SITE_OTHER): Payer: BC Managed Care – PPO

## 2019-10-12 DIAGNOSIS — J309 Allergic rhinitis, unspecified: Secondary | ICD-10-CM | POA: Diagnosis not present

## 2019-10-20 ENCOUNTER — Ambulatory Visit: Payer: Self-pay

## 2019-10-20 ENCOUNTER — Encounter: Payer: Self-pay | Admitting: Allergy & Immunology

## 2019-10-20 ENCOUNTER — Ambulatory Visit: Payer: BC Managed Care – PPO | Admitting: Allergy & Immunology

## 2019-10-20 ENCOUNTER — Other Ambulatory Visit: Payer: Self-pay

## 2019-10-20 VITALS — BP 176/108 | HR 86 | Temp 97.3°F | Resp 18 | Ht 72.0 in | Wt >= 6400 oz

## 2019-10-20 DIAGNOSIS — J3089 Other allergic rhinitis: Secondary | ICD-10-CM

## 2019-10-20 DIAGNOSIS — J302 Other seasonal allergic rhinitis: Secondary | ICD-10-CM

## 2019-10-20 DIAGNOSIS — J309 Allergic rhinitis, unspecified: Secondary | ICD-10-CM

## 2019-10-20 NOTE — Progress Notes (Signed)
FOLLOW UP  Date of Service/Encounter:  10/20/19   Assessment:   Seasonal and perennial allergic rhinitis(trees, weeds, grasses, indoor molds, outdoor molds, dust mites and cockroach) - reached maintenance in July 2020  Plan/Recommendations:   1. Seasonal and perennial allergic rhinitis (trees, weeds, grasses, indoor molds, outdoor molds, dust mites and cockroach) - Continue with: Zyrtec (cetirizine) 10mg  tablet once daily and Singulair (montelukast) 10mg  daily AS NEEDED. - You can use an extra dose of the antihistamine, if needed, for breakthrough symptoms.  - Continue with allergy shots at the same schedule. - Anticipate continuing through July 2025 for five years total.   2. Return in about 1 year (around 10/19/2020). This can be an in-person, a virtual Webex or a telephone follow up visit.   Subjective:   Chais Fehringer is a 34 y.o. male presenting today for follow up of  Chief Complaint  Patient presents with  . Allergic Rhinitis     Thayne Cindric has a history of the following: There are no problems to display for this patient.   History obtained from: chart review and patient and wife.  Mannie is a 34 y.o. male presenting for a follow up visit.  He was last seen in October 2020.  At that time, we continued his Zyrtec as well as his Singulair.  We also continue with allergy shots at the same schedule.  He was doing very well with his allergic rhinitis control.  He had not needed antibiotics in quite some time.  Since last visit, Noelle has done very well.  His allergy shots seem to be controlling his symptoms.  He essentially is not using any of his medications, instead using them on a as needed basis.  He does use an antihistamine prior to allergy shots.  He does have an up-to-date epinephrine autoinjector.  Shawnte[place] is on allergen immunotherapy. He receives two injections. Immunotherapy script #1 contains molds, dust mites and cockroach. He  currently receives 0.60mL of the RED vial (1/100). Immunotherapy script #2 contains trees, weeds and grasses. He currently receives 0.60mL of the RED vial (1/100). He started shots November of 2019 and reached maintenance in July of 2020.  He does have hypertension which is not well controlled.  He tells me that the HCTZ/lisinopril that he is on for control of his blood pressure leads to quite a bit of cramping, especially during the hot summer months.  He has not talked to his doctor about changing the medication but he will take care of them.  He also recently torn his left ACL by falling down some stairs.  Unfortunately, they went to any surgery until he gets down to 250 pounds.  He has lost 14 pounds so far.  Otherwise, there have been no changes to his past medical history, surgical history, family history, or social history.    Review of Systems  Constitutional: Negative.  Negative for chills, fever, malaise/fatigue and weight loss.  HENT: Positive for congestion and sinus pain. Negative for ear discharge and ear pain.        Positive for postnasal drip.  Positive for throat clearing.  Eyes: Negative for pain, discharge and redness.  Respiratory: Negative for cough, sputum production, shortness of breath and wheezing.   Cardiovascular: Negative.  Negative for chest pain and palpitations.  Gastrointestinal: Negative for abdominal pain, constipation, diarrhea, heartburn, nausea and vomiting.  Skin: Negative.  Negative for itching and rash.  Neurological: Negative for dizziness and headaches.  Endo/Heme/Allergies: Positive for environmental allergies. Does  not bruise/bleed easily.       Objective:   Blood pressure (!) 176/108, pulse 86, temperature (!) 97.3 F (36.3 C), temperature source Oral, resp. rate 18, height 6' (1.829 m), weight (!) 490 lb (222.3 kg), SpO2 97 %. Body mass index is 66.46 kg/m.   Physical Exam: General:  alert, active, in no acute distress. Obese male.  Cooperative with the exam.  Head:  normocephalic, no masses, lesions, tenderness or abnormalities Eyes:  conjunctiva clear without injection or discharge, EOMI, PERL Ears:  TM's pearly white bilaterally, external auditory canals are clear, external ears are normally set and rotated Nose:  External nose within normal limits, erythematous appearing turbinates, clear-colored discharge, septum midline Throat:  moist mucous membranes without erythema, exudates or petechiae, no thrush Neck:  Supple without thyromegaly or adenopathy appreciated Lungs:  clear to auscultation, no wheezing, crackles or rhonchi, breathing unlabored, moving air well in all lung fields Heart:  regular rate and rhythm, normal S1/S2, no murmurs or gallops, normal peripheral perfusion Neuro:  Normal mental status, speech normal, alert and oriented x3 Musculoskeletal:  no cyanosis, clubbing or edema Skin:  skin color, texture and turgor are normal; no bruising, rashes or lesions noted. Psych: Normal Affect and mood   Diagnostic studies: none       Salvatore Marvel, MD  [facility]

## 2019-10-20 NOTE — Patient Instructions (Addendum)
1. Seasonal and perennial allergic rhinitis (trees, weeds, grasses, indoor molds, outdoor molds, dust mites and cockroach) - Continue with: Zyrtec (cetirizine) 10mg  tablet once daily and Singulair (montelukast) 10mg  daily AS NEEDED. - You can use an extra dose of the antihistamine, if needed, for breakthrough symptoms.  - Continue with allergy shots at the same schedule. - Anticipate continuing through November 2024 for five years total.   2. Return in about 1 year (around 10/19/2020). This can be an in-person, a virtual Webex or a telephone follow up visit.   Please inform December 2024 of any Emergency Department visits, hospitalizations, or changes in symptoms. Call 10/21/2020 before going to the ED for breathing or allergy symptoms since we might be able to fit you in for a sick visit. Feel free to contact us anytime with any questions, problems, or concerns.  It was a pleasure to see you and your family again today!  Websites that have reliable patient information: 1. American Academy of Asthma, Allergy, and Immunology: www.aaaai.org 2. Food Allergy Research and Education (FARE): foodallergy.org 3. Mothers of Asthmatics: http://www.asthmacommunitynetwork.org 4. American College of Allergy, Asthma, and Immunology: www.acaai.org   COVID-19 Vaccine Information can be found at: [place] For questions related to vaccine distribution or appointments, please email [email or web address] or call [phone].     Like [email or web address] on [phone] and Instagram for our latest updates!        Make sure you are registered to vote! If you have moved or changed any of your contact information, you will need to get this updated before voting!  In some cases, you MAY be able to register to vote online: [place]

## 2019-11-01 ENCOUNTER — Ambulatory Visit (INDEPENDENT_AMBULATORY_CARE_PROVIDER_SITE_OTHER): Payer: BC Managed Care – PPO

## 2019-11-01 DIAGNOSIS — J309 Allergic rhinitis, unspecified: Secondary | ICD-10-CM | POA: Diagnosis not present

## 2019-11-08 ENCOUNTER — Ambulatory Visit (INDEPENDENT_AMBULATORY_CARE_PROVIDER_SITE_OTHER): Payer: BC Managed Care – PPO | Admitting: *Deleted

## 2019-11-08 DIAGNOSIS — J309 Allergic rhinitis, unspecified: Secondary | ICD-10-CM | POA: Diagnosis not present

## 2019-11-22 ENCOUNTER — Ambulatory Visit (INDEPENDENT_AMBULATORY_CARE_PROVIDER_SITE_OTHER): Payer: BC Managed Care – PPO

## 2019-11-22 DIAGNOSIS — J309 Allergic rhinitis, unspecified: Secondary | ICD-10-CM

## 2019-12-06 ENCOUNTER — Ambulatory Visit (INDEPENDENT_AMBULATORY_CARE_PROVIDER_SITE_OTHER): Payer: BC Managed Care – PPO

## 2019-12-06 DIAGNOSIS — J309 Allergic rhinitis, unspecified: Secondary | ICD-10-CM | POA: Diagnosis not present

## 2019-12-13 NOTE — Progress Notes (Signed)
VIALS EXP 12-12-20 

## 2019-12-16 NOTE — Progress Notes (Signed)
EXP 12/15/20 

## 2019-12-20 DIAGNOSIS — J301 Allergic rhinitis due to pollen: Secondary | ICD-10-CM

## 2019-12-21 ENCOUNTER — Ambulatory Visit (INDEPENDENT_AMBULATORY_CARE_PROVIDER_SITE_OTHER): Payer: BC Managed Care – PPO | Admitting: *Deleted

## 2019-12-21 DIAGNOSIS — J309 Allergic rhinitis, unspecified: Secondary | ICD-10-CM

## 2020-01-10 ENCOUNTER — Ambulatory Visit (INDEPENDENT_AMBULATORY_CARE_PROVIDER_SITE_OTHER): Payer: BC Managed Care – PPO

## 2020-01-10 DIAGNOSIS — J309 Allergic rhinitis, unspecified: Secondary | ICD-10-CM | POA: Diagnosis not present

## 2020-01-25 ENCOUNTER — Ambulatory Visit (INDEPENDENT_AMBULATORY_CARE_PROVIDER_SITE_OTHER): Payer: BC Managed Care – PPO

## 2020-01-25 DIAGNOSIS — J309 Allergic rhinitis, unspecified: Secondary | ICD-10-CM | POA: Diagnosis not present

## 2020-01-30 ENCOUNTER — Other Ambulatory Visit: Payer: Self-pay

## 2020-01-30 DIAGNOSIS — Z20822 Contact with and (suspected) exposure to covid-19: Secondary | ICD-10-CM

## 2020-02-01 LAB — NOVEL CORONAVIRUS, NAA: SARS-CoV-2, NAA: NOT DETECTED

## 2020-02-01 LAB — SARS-COV-2, NAA 2 DAY TAT

## 2020-02-13 ENCOUNTER — Ambulatory Visit: Payer: Self-pay

## 2020-02-13 DIAGNOSIS — J309 Allergic rhinitis, unspecified: Secondary | ICD-10-CM

## 2020-02-21 ENCOUNTER — Ambulatory Visit: Payer: Self-pay | Admitting: *Deleted

## 2020-02-21 DIAGNOSIS — J309 Allergic rhinitis, unspecified: Secondary | ICD-10-CM

## 2020-02-28 ENCOUNTER — Ambulatory Visit: Payer: Self-pay

## 2020-02-28 DIAGNOSIS — J309 Allergic rhinitis, unspecified: Secondary | ICD-10-CM

## 2020-03-06 ENCOUNTER — Ambulatory Visit: Payer: Self-pay | Admitting: *Deleted

## 2020-03-06 DIAGNOSIS — J309 Allergic rhinitis, unspecified: Secondary | ICD-10-CM

## 2020-03-13 ENCOUNTER — Ambulatory Visit: Payer: Self-pay

## 2020-03-13 DIAGNOSIS — J309 Allergic rhinitis, unspecified: Secondary | ICD-10-CM

## 2020-03-20 ENCOUNTER — Ambulatory Visit: Payer: Self-pay

## 2020-03-20 DIAGNOSIS — J309 Allergic rhinitis, unspecified: Secondary | ICD-10-CM

## 2020-04-03 ENCOUNTER — Ambulatory Visit: Payer: Self-pay

## 2020-04-03 DIAGNOSIS — J309 Allergic rhinitis, unspecified: Secondary | ICD-10-CM

## 2020-04-17 ENCOUNTER — Ambulatory Visit (INDEPENDENT_AMBULATORY_CARE_PROVIDER_SITE_OTHER): Payer: 59

## 2020-04-17 DIAGNOSIS — J309 Allergic rhinitis, unspecified: Secondary | ICD-10-CM

## 2020-05-01 ENCOUNTER — Ambulatory Visit (INDEPENDENT_AMBULATORY_CARE_PROVIDER_SITE_OTHER): Payer: 59

## 2020-05-01 DIAGNOSIS — J309 Allergic rhinitis, unspecified: Secondary | ICD-10-CM | POA: Diagnosis not present

## 2020-05-09 DIAGNOSIS — J3089 Other allergic rhinitis: Secondary | ICD-10-CM | POA: Diagnosis not present

## 2020-05-09 NOTE — Progress Notes (Signed)
VIALS EXP 05-09-21 

## 2020-05-15 ENCOUNTER — Ambulatory Visit (INDEPENDENT_AMBULATORY_CARE_PROVIDER_SITE_OTHER): Payer: 59

## 2020-05-15 DIAGNOSIS — J309 Allergic rhinitis, unspecified: Secondary | ICD-10-CM

## 2020-05-21 DIAGNOSIS — J301 Allergic rhinitis due to pollen: Secondary | ICD-10-CM | POA: Diagnosis not present

## 2020-05-21 NOTE — Progress Notes (Signed)
LABELS FOR BILLING 

## 2020-05-29 ENCOUNTER — Ambulatory Visit (INDEPENDENT_AMBULATORY_CARE_PROVIDER_SITE_OTHER): Payer: 59

## 2020-05-29 DIAGNOSIS — J309 Allergic rhinitis, unspecified: Secondary | ICD-10-CM

## 2020-06-12 ENCOUNTER — Ambulatory Visit (INDEPENDENT_AMBULATORY_CARE_PROVIDER_SITE_OTHER): Payer: 59

## 2020-06-12 DIAGNOSIS — J309 Allergic rhinitis, unspecified: Secondary | ICD-10-CM | POA: Diagnosis not present

## 2020-06-19 ENCOUNTER — Ambulatory Visit (INDEPENDENT_AMBULATORY_CARE_PROVIDER_SITE_OTHER): Payer: 59

## 2020-06-19 DIAGNOSIS — J309 Allergic rhinitis, unspecified: Secondary | ICD-10-CM | POA: Diagnosis not present

## 2020-07-03 ENCOUNTER — Ambulatory Visit (INDEPENDENT_AMBULATORY_CARE_PROVIDER_SITE_OTHER): Payer: 59

## 2020-07-03 DIAGNOSIS — J309 Allergic rhinitis, unspecified: Secondary | ICD-10-CM | POA: Diagnosis not present

## 2020-07-10 ENCOUNTER — Ambulatory Visit (INDEPENDENT_AMBULATORY_CARE_PROVIDER_SITE_OTHER): Payer: 59

## 2020-07-10 DIAGNOSIS — J309 Allergic rhinitis, unspecified: Secondary | ICD-10-CM | POA: Diagnosis not present

## 2020-07-16 ENCOUNTER — Ambulatory Visit (INDEPENDENT_AMBULATORY_CARE_PROVIDER_SITE_OTHER): Payer: 59

## 2020-07-16 DIAGNOSIS — J309 Allergic rhinitis, unspecified: Secondary | ICD-10-CM | POA: Diagnosis not present

## 2020-07-24 ENCOUNTER — Ambulatory Visit: Payer: Self-pay | Admitting: Allergy & Immunology

## 2020-08-06 ENCOUNTER — Ambulatory Visit (INDEPENDENT_AMBULATORY_CARE_PROVIDER_SITE_OTHER): Payer: 59

## 2020-08-06 DIAGNOSIS — J309 Allergic rhinitis, unspecified: Secondary | ICD-10-CM

## 2020-08-24 ENCOUNTER — Ambulatory Visit (INDEPENDENT_AMBULATORY_CARE_PROVIDER_SITE_OTHER): Payer: 59

## 2020-08-24 DIAGNOSIS — J309 Allergic rhinitis, unspecified: Secondary | ICD-10-CM

## 2020-09-19 ENCOUNTER — Ambulatory Visit (INDEPENDENT_AMBULATORY_CARE_PROVIDER_SITE_OTHER): Payer: 59

## 2020-09-19 DIAGNOSIS — J309 Allergic rhinitis, unspecified: Secondary | ICD-10-CM | POA: Diagnosis not present

## 2020-09-25 ENCOUNTER — Ambulatory Visit: Payer: Self-pay | Admitting: Allergy & Immunology

## 2020-10-03 DIAGNOSIS — J302 Other seasonal allergic rhinitis: Secondary | ICD-10-CM

## 2020-10-03 NOTE — Progress Notes (Signed)
VIALS MADE & EXP 10-03-21 

## 2020-10-10 ENCOUNTER — Ambulatory Visit (INDEPENDENT_AMBULATORY_CARE_PROVIDER_SITE_OTHER): Payer: 59

## 2020-10-10 DIAGNOSIS — J309 Allergic rhinitis, unspecified: Secondary | ICD-10-CM

## 2020-10-18 DIAGNOSIS — J3089 Other allergic rhinitis: Secondary | ICD-10-CM | POA: Diagnosis not present

## 2020-10-31 ENCOUNTER — Ambulatory Visit: Payer: 59 | Admitting: Family

## 2020-11-01 ENCOUNTER — Ambulatory Visit (INDEPENDENT_AMBULATORY_CARE_PROVIDER_SITE_OTHER): Payer: 59

## 2020-11-01 DIAGNOSIS — J309 Allergic rhinitis, unspecified: Secondary | ICD-10-CM | POA: Diagnosis not present

## 2020-11-21 NOTE — Patient Instructions (Addendum)
1. Seasonal and perennial allergic rhinitis (trees, weeds, grasses, indoor molds, outdoor molds, dust mites and cockroach) - Continue with: Zyrtec (cetirizine) 10mg  tablet once daily and Singulair (montelukast) 10mg  daily AS NEEDED. - You can use an extra dose of the antihistamine, if needed, for breakthrough symptoms.  - Continue with allergy injections as per protocol - Continue immunotherapy through November 2024 for five years total.   Your blood pressure was extremely high at today's office visit.  Schedule an appointment to discuss this with your primary care physician  Schedule a follow up appoinment in 1 year or sooner

## 2020-11-22 ENCOUNTER — Other Ambulatory Visit: Payer: Self-pay

## 2020-11-22 ENCOUNTER — Ambulatory Visit (INDEPENDENT_AMBULATORY_CARE_PROVIDER_SITE_OTHER): Payer: 59 | Admitting: Family

## 2020-11-22 ENCOUNTER — Encounter: Payer: Self-pay | Admitting: Family

## 2020-11-22 VITALS — BP 160/100 | HR 72 | Temp 99.1°F | Resp 16 | Ht 73.0 in | Wt 362.8 lb

## 2020-11-22 DIAGNOSIS — J309 Allergic rhinitis, unspecified: Secondary | ICD-10-CM | POA: Diagnosis not present

## 2020-11-22 DIAGNOSIS — J302 Other seasonal allergic rhinitis: Secondary | ICD-10-CM | POA: Diagnosis not present

## 2020-11-22 NOTE — Progress Notes (Addendum)
[address] [place] [address] Dept: [phone]  FOLLOW UP NOTE  Patient ID: Timothy Jenkins, male    DOB: Oct 21, 1985  Age: 35 y.o. MRN: [id number] Date of Office Visit: 11/22/2020  Assessment  Chief Complaint: Follow-up and Allergic Rhinitis   HPI Timothy Jenkins a 35 year old male who presents today for follow-up of seasonal and perennial allergic rhinitis.  He was last seen on October 20, 2019 by Dr. Dellis Anes. 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  Seasonal and perennial allergic rhinitis is reported as moderately controlled with Zyrtec 10 mg as needed, Dymista nasal spray as needed, and allergy injections per protocol.  He reports occasional postnasal drip and denies rhinorrhea and nasal congestion.  He has not had any sinus infections since we last saw him.  He reports that his allergy injections do help and he denies any large local reactions.   Drug Allergies:  Allergies  Allergen Reactions   Hydrocodone Nausea Only    Review of Systems: Review of Systems  Constitutional:  Negative for chills and fever.  HENT:         Reports occasional postnasal drip and denies rhinorrhea and nasal congestion.  Eyes:        Denies itchy watery eyes.  Reports that he now wears contacts  Respiratory:  Negative for cough, shortness of breath and wheezing.   Cardiovascular:  Negative for chest pain and palpitations.  Gastrointestinal:        Reports reflux symptoms very rarely if he eats greasy foods  Genitourinary:  Negative for dysuria.  Skin:  Negative for itching and rash.  Neurological:  Negative for headaches.  Endo/Heme/Allergies:  Positive for environmental allergies.    Physical Exam: BP (!) 160/100 (Patient Position: Sitting, Cuff Size: Large)   Pulse 72   Temp 99.1 F (37.3 C) (Temporal)   Resp 16   Ht 6\' 1"  (1.854 m)   Wt (!) 362 lb 12.8 oz (164.6 kg)   SpO2 96%   BMI 47.87 kg/m    Physical  Exam Constitutional:      Appearance: Normal appearance.  HENT:     Head: Normocephalic and atraumatic.     Comments: Pharynx normal, eyes normal, ears normal, nose: Bilateral lower turbinates moderately edematous and slightly erythematous with clear drainage noted    Right Ear: Tympanic membrane, ear canal and external ear normal.     Left Ear: Tympanic membrane, ear canal and external ear normal.     Mouth/Throat:     Mouth: Mucous membranes are moist.     Pharynx: Oropharynx is clear.  Eyes:     Conjunctiva/sclera: Conjunctivae normal.  Cardiovascular:     Rate and Rhythm: Normal rate and regular rhythm.  Pulmonary:     Effort: Pulmonary effort is normal.     Breath sounds: Normal breath sounds.     Comments: Lungs clear to auscultation Musculoskeletal:     Cervical back: Neck supple.  Skin:    General: Skin is warm.  Neurological:     Mental Status: He is alert and oriented to person, place, and time.  Psychiatric:        Mood and Affect: Mood normal.        Behavior: Behavior normal.        Thought Content: Thought content normal.        Judgment: Judgment normal.    Diagnostics: None  Assessment and Plan: 1. Seasonal and perennial allergic rhinitis  No orders of the defined types were placed in this encounter.   Patient Instructions  1. Seasonal and perennial allergic rhinitis (trees, weeds, grasses, indoor molds, outdoor molds, dust mites and cockroach) - Continue with: Zyrtec (cetirizine) 10mg  tablet once daily and Singulair (montelukast) 10mg  daily AS NEEDED. - You can use an extra dose of the antihistamine, if needed, for breakthrough symptoms.  - Continue with allergy injections as per protocol - Continue immunotherapy through November 2024 for five years total.   Your blood pressure was extremely high at today's office visit.  Schedule an appointment to discuss this with your primary care physician  Schedule a follow up appoinment in 1 year or  sooner      Return in about 1 year (around 11/22/2021), or if symptoms worsen or fail to improve.    Thank you for the opportunity to care for this patient.  Please do not hesitate to contact me with questions.  December 2024, FNP [facility]

## 2020-12-06 ENCOUNTER — Ambulatory Visit (INDEPENDENT_AMBULATORY_CARE_PROVIDER_SITE_OTHER): Payer: 59

## 2020-12-06 DIAGNOSIS — J309 Allergic rhinitis, unspecified: Secondary | ICD-10-CM | POA: Diagnosis not present

## 2020-12-13 ENCOUNTER — Ambulatory Visit (INDEPENDENT_AMBULATORY_CARE_PROVIDER_SITE_OTHER): Payer: 59

## 2020-12-13 DIAGNOSIS — J309 Allergic rhinitis, unspecified: Secondary | ICD-10-CM | POA: Diagnosis not present

## 2020-12-20 ENCOUNTER — Ambulatory Visit (INDEPENDENT_AMBULATORY_CARE_PROVIDER_SITE_OTHER): Payer: 59

## 2020-12-20 DIAGNOSIS — J309 Allergic rhinitis, unspecified: Secondary | ICD-10-CM

## 2020-12-26 ENCOUNTER — Ambulatory Visit (INDEPENDENT_AMBULATORY_CARE_PROVIDER_SITE_OTHER): Payer: 59

## 2020-12-26 DIAGNOSIS — J309 Allergic rhinitis, unspecified: Secondary | ICD-10-CM

## 2021-01-03 ENCOUNTER — Ambulatory Visit (INDEPENDENT_AMBULATORY_CARE_PROVIDER_SITE_OTHER): Payer: 59

## 2021-01-03 DIAGNOSIS — J309 Allergic rhinitis, unspecified: Secondary | ICD-10-CM | POA: Diagnosis not present

## 2021-01-23 ENCOUNTER — Ambulatory Visit (INDEPENDENT_AMBULATORY_CARE_PROVIDER_SITE_OTHER): Payer: 59

## 2021-01-23 DIAGNOSIS — J309 Allergic rhinitis, unspecified: Secondary | ICD-10-CM

## 2021-02-20 ENCOUNTER — Ambulatory Visit (INDEPENDENT_AMBULATORY_CARE_PROVIDER_SITE_OTHER): Payer: 59

## 2021-02-20 DIAGNOSIS — J309 Allergic rhinitis, unspecified: Secondary | ICD-10-CM

## 2021-03-18 ENCOUNTER — Ambulatory Visit (INDEPENDENT_AMBULATORY_CARE_PROVIDER_SITE_OTHER): Payer: 59

## 2021-03-18 DIAGNOSIS — J309 Allergic rhinitis, unspecified: Secondary | ICD-10-CM

## 2021-04-03 DIAGNOSIS — J302 Other seasonal allergic rhinitis: Secondary | ICD-10-CM | POA: Diagnosis not present

## 2021-04-03 NOTE — Progress Notes (Signed)
VIALS MADE. EXP 04-03-22 

## 2021-04-04 DIAGNOSIS — J3089 Other allergic rhinitis: Secondary | ICD-10-CM | POA: Diagnosis not present

## 2021-04-08 ENCOUNTER — Ambulatory Visit (INDEPENDENT_AMBULATORY_CARE_PROVIDER_SITE_OTHER): Payer: 59

## 2021-04-08 DIAGNOSIS — J309 Allergic rhinitis, unspecified: Secondary | ICD-10-CM

## 2021-05-01 ENCOUNTER — Ambulatory Visit (INDEPENDENT_AMBULATORY_CARE_PROVIDER_SITE_OTHER): Payer: 59

## 2021-05-01 DIAGNOSIS — J309 Allergic rhinitis, unspecified: Secondary | ICD-10-CM

## 2021-05-16 NOTE — Progress Notes (Signed)
LABEL FOR INSURANCE

## 2021-05-21 ENCOUNTER — Ambulatory Visit (INDEPENDENT_AMBULATORY_CARE_PROVIDER_SITE_OTHER): Payer: 59

## 2021-05-21 DIAGNOSIS — J309 Allergic rhinitis, unspecified: Secondary | ICD-10-CM | POA: Diagnosis not present

## 2021-05-28 ENCOUNTER — Ambulatory Visit (INDEPENDENT_AMBULATORY_CARE_PROVIDER_SITE_OTHER): Payer: 59

## 2021-05-28 DIAGNOSIS — J309 Allergic rhinitis, unspecified: Secondary | ICD-10-CM

## 2021-06-07 ENCOUNTER — Ambulatory Visit (INDEPENDENT_AMBULATORY_CARE_PROVIDER_SITE_OTHER): Payer: 59

## 2021-06-07 DIAGNOSIS — J309 Allergic rhinitis, unspecified: Secondary | ICD-10-CM | POA: Diagnosis not present

## 2021-06-10 ENCOUNTER — Ambulatory Visit (INDEPENDENT_AMBULATORY_CARE_PROVIDER_SITE_OTHER): Payer: 59 | Admitting: *Deleted

## 2021-06-10 DIAGNOSIS — J309 Allergic rhinitis, unspecified: Secondary | ICD-10-CM | POA: Diagnosis not present

## 2021-06-17 ENCOUNTER — Ambulatory Visit (INDEPENDENT_AMBULATORY_CARE_PROVIDER_SITE_OTHER): Payer: 59

## 2021-06-17 DIAGNOSIS — J309 Allergic rhinitis, unspecified: Secondary | ICD-10-CM

## 2021-07-02 IMAGING — MR MRI OF THE RIGHT ANKLE WITHOUT CONTRAST
4 of 5 series · 13 of 40 positions shown · non-contrast
Comparison: None.

CLINICAL DATA: Right ankle pain for 3 months.  No known injury.

EXAM:
MRI OF THE RIGHT ANKLE WITHOUT CONTRAST
TECHNIQUE: Multiplanar, multisequence MR imaging of the ankle was performed. No
intravenous contrast was administered.

[Series 4: PD fat-sat · axial · left · 3.0mm · 0.25mm/px · z∈[-82,+37]mm · 4 of 36 slices shown]
[im 1/36]
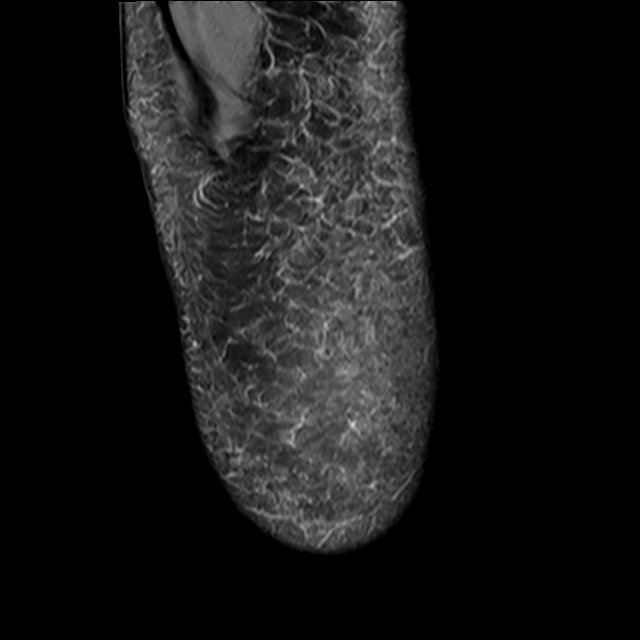
[im 5/36]
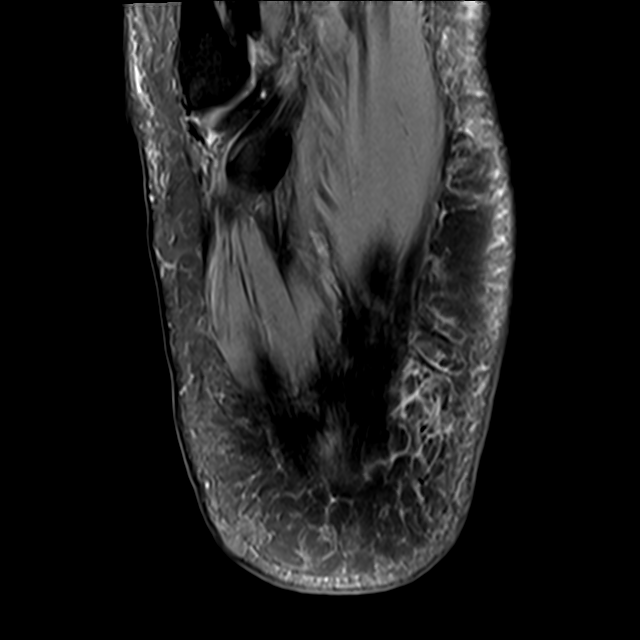
[im 18/36]
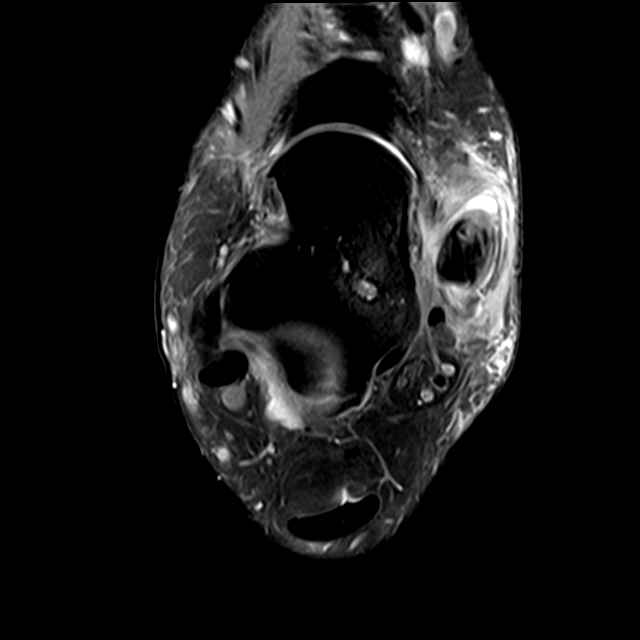
[im 31/36]
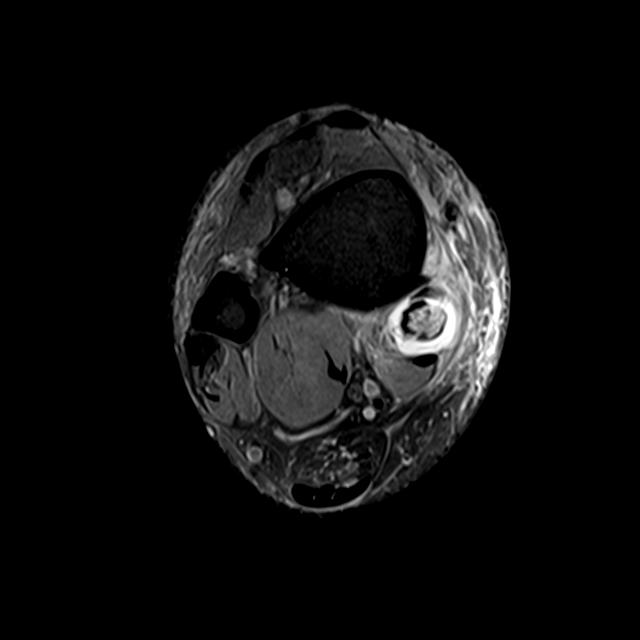

[Series 5: T2 fat-sat · axial · left · 3.0mm · 0.25mm/px · z∈[-66,+37]mm · 3 of 36 slices shown (1 of 2)]
[im 5/36]
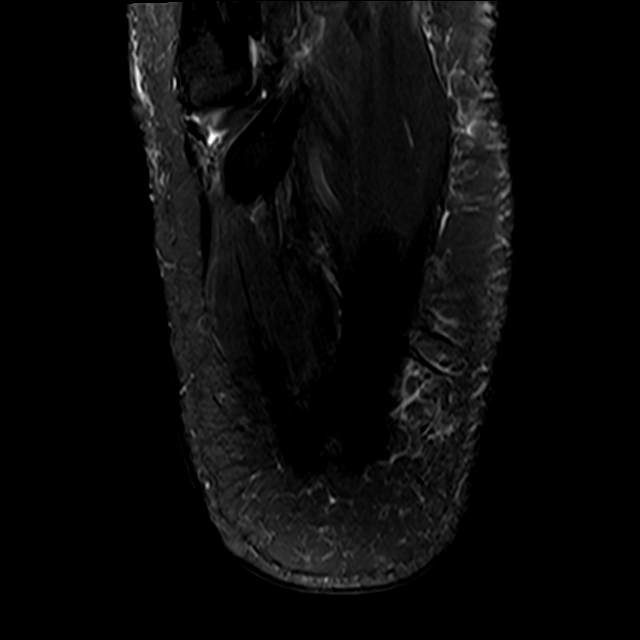
[im 18/36]
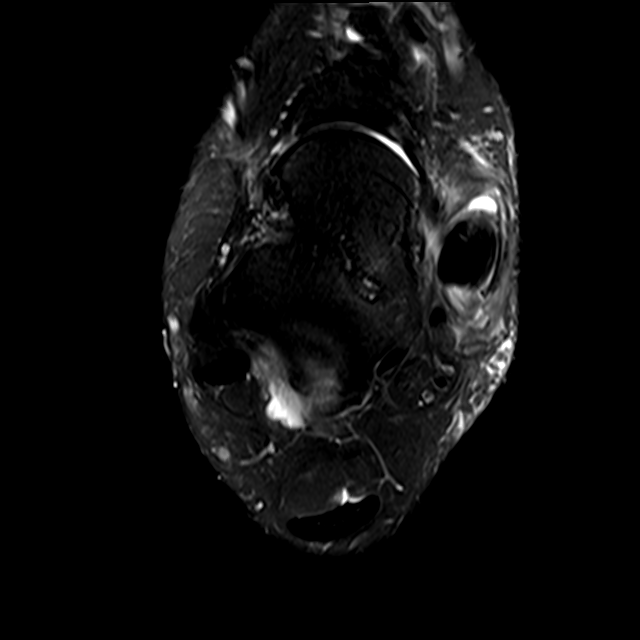
[im 31/36]
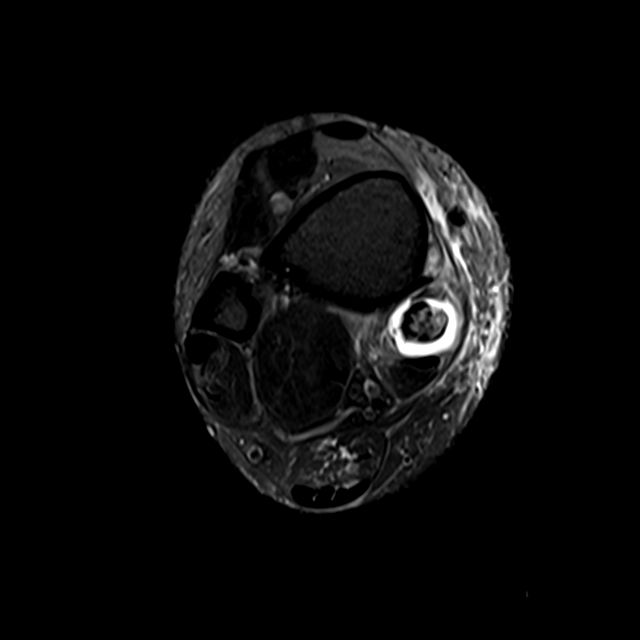

[Series 6: T1 · sagittal · left · 4.0mm · 0.28mm/px · 3 of 24 slices shown]
[im 5/24]
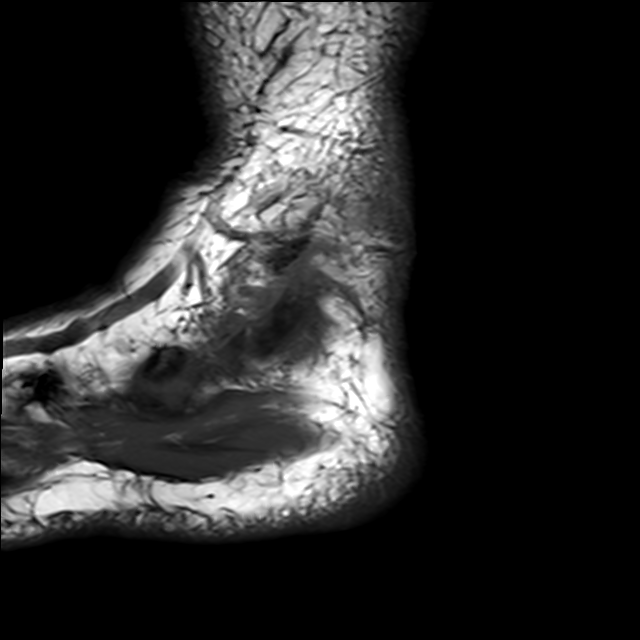
[im 14/24]
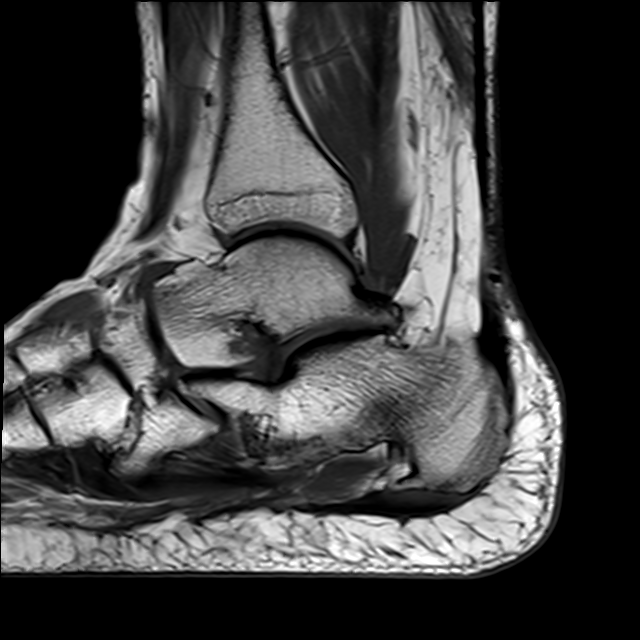
[im 24/24]
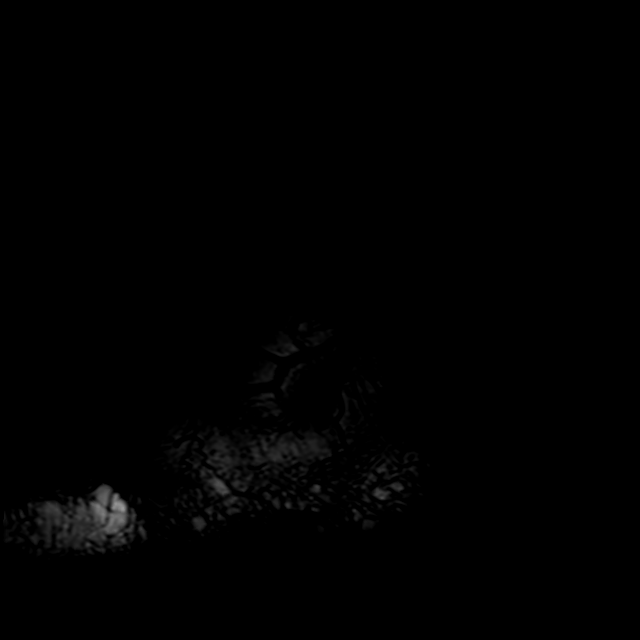

[Series 8: T2 fat-sat · coronal · left · 3.0mm · 0.25mm/px · 3 of 39 slices shown (2 of 2)]
[im 5/39]
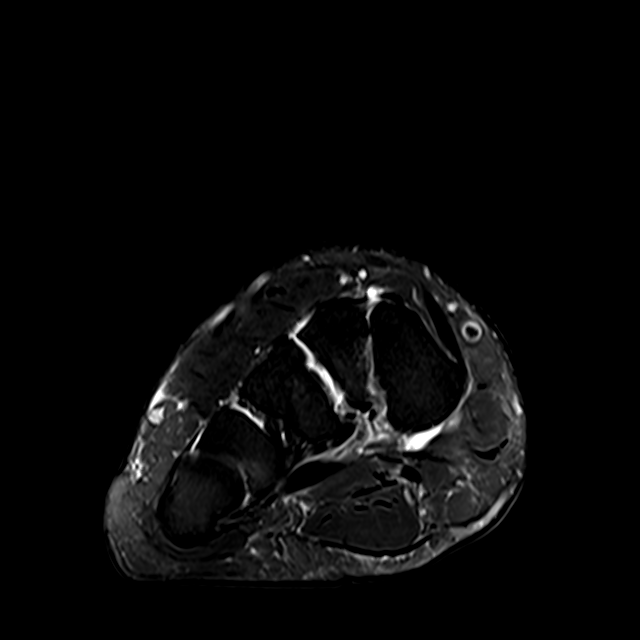
[im 22/39]
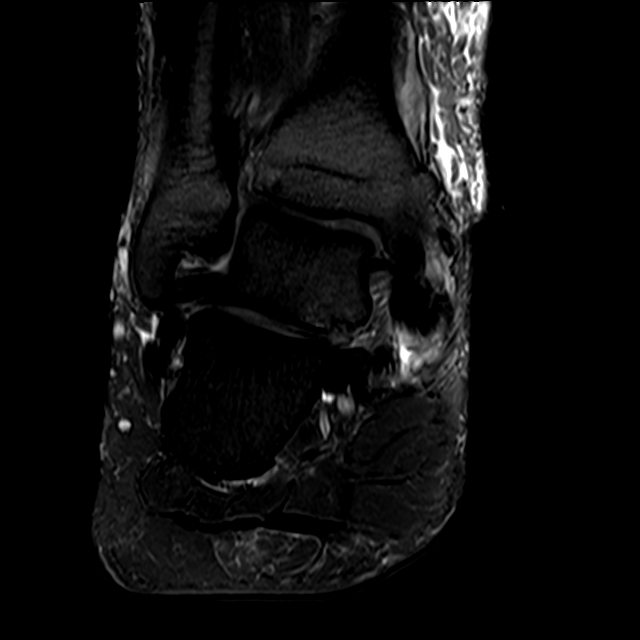
[im 34/39]
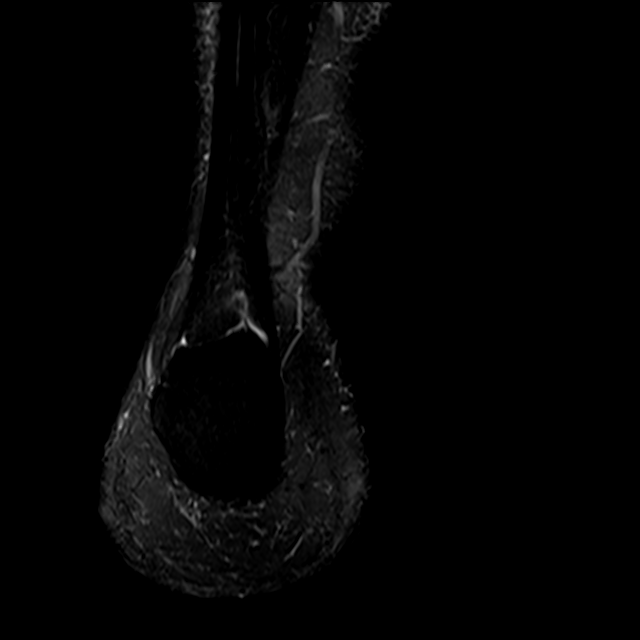

[13 of 40 positions shown; findings below may reference images not displayed]

FINDINGS: TENDONS

Peroneal: Intact.

Posteromedial: The patient has severe tibialis posterior tendinosis.
Posterior to the distal fibula, the tendon is markedly thickened
with a large interstitial tear. More distally, the tendon is
thickened with intrasubstance increased T2 signal. The flexor
hallucis longus and flexor digitorum longus are unremarkable.

Anterior: Intact.

Achilles: Intact.

Plantar Fascia: Intrasubstance increased T2 signal and thickening in
the plantar fascia are worst in the medial cord and consistent with
fasciitis. Mild reactive marrow edema is seen in a plantar calcaneal
spur.

LIGAMENTS

Lateral: Intact.

Medial: Intact.

CARTILAGE

Ankle Joint: Negative. No osteochondral lesion of the talar dome or
joint effusion.

Subtalar Joints/Sinus Tarsi: Mild degenerative change of the
subtalar joint is identified. Normal fat signal within the sinus
tarsi is obliterated and the roof of the sinus tarsi appears
expanded.

Bones: No fracture or worrisome lesion.

Other: None.
IMPRESSION: Dominant finding is severe PTT tendinosis. There is a large
interstitial tear of the tendon posterior to the distal tibia but no
complete tear is identified.

Plantar fasciitis without rupture.

Findings compatible with sinus tarsi syndrome.

## 2021-07-08 ENCOUNTER — Ambulatory Visit (INDEPENDENT_AMBULATORY_CARE_PROVIDER_SITE_OTHER): Payer: 59

## 2021-07-08 DIAGNOSIS — J309 Allergic rhinitis, unspecified: Secondary | ICD-10-CM | POA: Diagnosis not present

## 2021-07-29 ENCOUNTER — Ambulatory Visit (INDEPENDENT_AMBULATORY_CARE_PROVIDER_SITE_OTHER): Payer: 59

## 2021-07-29 DIAGNOSIS — J309 Allergic rhinitis, unspecified: Secondary | ICD-10-CM | POA: Diagnosis not present

## 2021-08-26 ENCOUNTER — Ambulatory Visit (INDEPENDENT_AMBULATORY_CARE_PROVIDER_SITE_OTHER): Payer: 59 | Admitting: *Deleted

## 2021-08-26 DIAGNOSIS — J309 Allergic rhinitis, unspecified: Secondary | ICD-10-CM

## 2021-09-20 ENCOUNTER — Ambulatory Visit (INDEPENDENT_AMBULATORY_CARE_PROVIDER_SITE_OTHER): Payer: 59

## 2021-09-20 DIAGNOSIS — J309 Allergic rhinitis, unspecified: Secondary | ICD-10-CM | POA: Diagnosis not present

## 2021-10-16 DIAGNOSIS — J301 Allergic rhinitis due to pollen: Secondary | ICD-10-CM

## 2021-10-16 NOTE — Progress Notes (Signed)
EXP 10/17/22 

## 2021-10-17 ENCOUNTER — Ambulatory Visit (INDEPENDENT_AMBULATORY_CARE_PROVIDER_SITE_OTHER): Payer: 59

## 2021-10-17 DIAGNOSIS — J309 Allergic rhinitis, unspecified: Secondary | ICD-10-CM

## 2021-10-31 DIAGNOSIS — J3089 Other allergic rhinitis: Secondary | ICD-10-CM | POA: Diagnosis not present

## 2021-11-14 ENCOUNTER — Ambulatory Visit (INDEPENDENT_AMBULATORY_CARE_PROVIDER_SITE_OTHER): Payer: 59

## 2021-11-14 DIAGNOSIS — J309 Allergic rhinitis, unspecified: Secondary | ICD-10-CM

## 2021-11-20 ENCOUNTER — Ambulatory Visit (INDEPENDENT_AMBULATORY_CARE_PROVIDER_SITE_OTHER): Payer: 59

## 2021-11-20 DIAGNOSIS — J309 Allergic rhinitis, unspecified: Secondary | ICD-10-CM

## 2021-11-29 ENCOUNTER — Ambulatory Visit (INDEPENDENT_AMBULATORY_CARE_PROVIDER_SITE_OTHER): Payer: 59 | Admitting: *Deleted

## 2021-11-29 DIAGNOSIS — J309 Allergic rhinitis, unspecified: Secondary | ICD-10-CM

## 2021-12-05 ENCOUNTER — Ambulatory Visit (INDEPENDENT_AMBULATORY_CARE_PROVIDER_SITE_OTHER): Payer: 59

## 2021-12-05 DIAGNOSIS — J309 Allergic rhinitis, unspecified: Secondary | ICD-10-CM | POA: Diagnosis not present

## 2021-12-11 ENCOUNTER — Ambulatory Visit (INDEPENDENT_AMBULATORY_CARE_PROVIDER_SITE_OTHER): Payer: 59

## 2021-12-11 DIAGNOSIS — J309 Allergic rhinitis, unspecified: Secondary | ICD-10-CM

## 2022-01-06 ENCOUNTER — Ambulatory Visit (INDEPENDENT_AMBULATORY_CARE_PROVIDER_SITE_OTHER): Payer: 59

## 2022-01-06 DIAGNOSIS — J309 Allergic rhinitis, unspecified: Secondary | ICD-10-CM

## 2022-02-03 ENCOUNTER — Ambulatory Visit (INDEPENDENT_AMBULATORY_CARE_PROVIDER_SITE_OTHER): Payer: 59

## 2022-02-03 DIAGNOSIS — J309 Allergic rhinitis, unspecified: Secondary | ICD-10-CM

## 2022-02-20 DIAGNOSIS — J3089 Other allergic rhinitis: Secondary | ICD-10-CM | POA: Diagnosis not present

## 2022-02-20 NOTE — Progress Notes (Signed)
VIALS EXP 02-25-23 

## 2022-03-06 ENCOUNTER — Ambulatory Visit (INDEPENDENT_AMBULATORY_CARE_PROVIDER_SITE_OTHER): Payer: 59 | Admitting: *Deleted

## 2022-03-06 DIAGNOSIS — J309 Allergic rhinitis, unspecified: Secondary | ICD-10-CM

## 2022-03-13 DIAGNOSIS — J301 Allergic rhinitis due to pollen: Secondary | ICD-10-CM | POA: Diagnosis not present

## 2022-04-01 ENCOUNTER — Ambulatory Visit (INDEPENDENT_AMBULATORY_CARE_PROVIDER_SITE_OTHER): Payer: 59

## 2022-04-01 DIAGNOSIS — J309 Allergic rhinitis, unspecified: Secondary | ICD-10-CM | POA: Diagnosis not present

## 2022-05-01 ENCOUNTER — Ambulatory Visit: Payer: Self-pay

## 2022-05-05 ENCOUNTER — Ambulatory Visit (INDEPENDENT_AMBULATORY_CARE_PROVIDER_SITE_OTHER): Payer: No Typology Code available for payment source

## 2022-05-05 DIAGNOSIS — J309 Allergic rhinitis, unspecified: Secondary | ICD-10-CM

## 2022-06-02 ENCOUNTER — Ambulatory Visit (INDEPENDENT_AMBULATORY_CARE_PROVIDER_SITE_OTHER): Payer: No Typology Code available for payment source

## 2022-06-02 DIAGNOSIS — J309 Allergic rhinitis, unspecified: Secondary | ICD-10-CM | POA: Diagnosis not present

## 2022-06-09 ENCOUNTER — Ambulatory Visit (INDEPENDENT_AMBULATORY_CARE_PROVIDER_SITE_OTHER): Payer: No Typology Code available for payment source

## 2022-06-09 DIAGNOSIS — J309 Allergic rhinitis, unspecified: Secondary | ICD-10-CM | POA: Diagnosis not present

## 2022-06-17 ENCOUNTER — Ambulatory Visit (INDEPENDENT_AMBULATORY_CARE_PROVIDER_SITE_OTHER): Payer: No Typology Code available for payment source

## 2022-06-17 DIAGNOSIS — J309 Allergic rhinitis, unspecified: Secondary | ICD-10-CM

## 2022-06-23 ENCOUNTER — Ambulatory Visit (INDEPENDENT_AMBULATORY_CARE_PROVIDER_SITE_OTHER): Payer: No Typology Code available for payment source

## 2022-06-23 DIAGNOSIS — J309 Allergic rhinitis, unspecified: Secondary | ICD-10-CM | POA: Diagnosis not present

## 2022-06-27 ENCOUNTER — Ambulatory Visit: Payer: No Typology Code available for payment source | Admitting: Family

## 2022-06-30 ENCOUNTER — Ambulatory Visit (INDEPENDENT_AMBULATORY_CARE_PROVIDER_SITE_OTHER): Payer: No Typology Code available for payment source

## 2022-06-30 DIAGNOSIS — J309 Allergic rhinitis, unspecified: Secondary | ICD-10-CM

## 2022-08-05 ENCOUNTER — Ambulatory Visit (INDEPENDENT_AMBULATORY_CARE_PROVIDER_SITE_OTHER): Payer: No Typology Code available for payment source

## 2022-08-05 DIAGNOSIS — J309 Allergic rhinitis, unspecified: Secondary | ICD-10-CM | POA: Diagnosis not present

## 2022-09-04 ENCOUNTER — Ambulatory Visit (INDEPENDENT_AMBULATORY_CARE_PROVIDER_SITE_OTHER): Payer: No Typology Code available for payment source

## 2022-09-04 DIAGNOSIS — J309 Allergic rhinitis, unspecified: Secondary | ICD-10-CM | POA: Diagnosis not present

## 2022-10-02 ENCOUNTER — Ambulatory Visit (INDEPENDENT_AMBULATORY_CARE_PROVIDER_SITE_OTHER): Payer: No Typology Code available for payment source

## 2022-10-02 DIAGNOSIS — J309 Allergic rhinitis, unspecified: Secondary | ICD-10-CM | POA: Diagnosis not present

## 2022-11-06 ENCOUNTER — Ambulatory Visit (INDEPENDENT_AMBULATORY_CARE_PROVIDER_SITE_OTHER): Payer: No Typology Code available for payment source

## 2022-11-06 DIAGNOSIS — J309 Allergic rhinitis, unspecified: Secondary | ICD-10-CM | POA: Diagnosis not present
